# Patient Record
Sex: Male | Born: 1975 | Race: Black or African American | Hispanic: No | Marital: Single | Smoking: Never smoker
Health system: Southern US, Community
[De-identification: ages and names within clinical notes are randomized; demographics above are authoritative.]

## PROBLEM LIST (undated history)

## (undated) DIAGNOSIS — E119 Type 2 diabetes mellitus without complications: Secondary | ICD-10-CM

---

## 2011-01-30 ENCOUNTER — Emergency Department (HOSPITAL_COMMUNITY)
Admission: EM | Admit: 2011-01-30 | Discharge: 2011-01-31 | Disposition: A | Discharge status: Home / Self Care | Payer: Self-pay | Attending: Emergency Medicine | Admitting: Emergency Medicine

## 2011-01-30 DIAGNOSIS — K089 Disorder of teeth and supporting structures, unspecified: Secondary | ICD-10-CM | POA: Insufficient documentation

## 2011-01-30 DIAGNOSIS — K047 Periapical abscess without sinus: Secondary | ICD-10-CM | POA: Insufficient documentation

## 2012-02-16 ENCOUNTER — Emergency Department (HOSPITAL_COMMUNITY)
Admission: EM | Admit: 2012-02-16 | Discharge: 2012-02-16 | Disposition: A | Discharge status: Home / Self Care | Payer: Self-pay | Attending: Emergency Medicine | Admitting: Emergency Medicine

## 2012-02-16 ENCOUNTER — Encounter (HOSPITAL_COMMUNITY): Payer: Self-pay | Admitting: Emergency Medicine

## 2012-02-16 DIAGNOSIS — K047 Periapical abscess without sinus: Secondary | ICD-10-CM | POA: Insufficient documentation

## 2012-02-16 MED ORDER — CLINDAMYCIN HCL 150 MG PO CAPS
300.0000 mg | ORAL_CAPSULE | Freq: Once | ORAL | Status: AC
  Administered 2012-02-16: 300 mg via ORAL
  Filled 2012-02-16: qty 2

## 2012-02-16 MED ORDER — CLINDAMYCIN HCL 150 MG PO CAPS: 150.0000 mg | ORAL_CAPSULE | Freq: Four times a day (QID) | ORAL | Status: DC

## 2012-02-16 MED ORDER — CLINDAMYCIN HCL 300 MG PO CAPS: 150.0000 mg | ORAL_CAPSULE | Freq: Three times a day (TID) | ORAL | Status: DC

## 2012-02-16 NOTE — Discharge Instructions (Signed)
Dental Abscess A dental abscess usually starts from an infected tooth. Antibiotic medicine and pain pills can be helpful, but dental infections require the attention of a dentist. Rinse around the infected area often with salt water (a pinch of salt in 8 oz of warm water). Do not apply heat to the outside of your face. See your dentist or oral surgeon as soon as possible.  SEEK IMMEDIATE MEDICAL CARE IF:  You have increasing, severe pain that is not relieved by medicine.   You or your child has an oral temperature above 102 F (38.9 C), not controlled by medicine.   Your baby is older than 3 months with a rectal temperature of 102 F (38.9 C) or higher.   Your baby is 3 months old or younger with a rectal temperature of 100.4 F (38 C) or higher.   You develop chills, severe headache, difficulty breathing, or trouble swallowing.   You have swelling in the neck or around the eye.  Document Released: 05/15/2005 Document Revised: 05/04/2011 Document Reviewed: 10/24/2006 [employer] Patient Information 2012 [employer]. 

## 2012-02-16 NOTE — ED Notes (Signed)
C/o abscess in L side of mouth x approx 2 months.

## 2012-02-16 NOTE — ED Notes (Signed)
Pt alert and oriented, with steady gait at time of discharge. Pt given discharge papers and papers explained. All questions answered and pt walked to discharge.  

## 2012-02-16 NOTE — ED Provider Notes (Signed)
History     CSN: [id number]  Arrival date & time 02/16/12  2015   First MD Initiated Contact with Patient 02/16/12 2039      Chief Complaint  Patient presents with  . Abscess    (Consider location/radiation/quality/duration/timing/severity/associated sxs/prior treatment) HPI Comments: Patient states, that he has had a dental abscess in the left lower area since January that waxes and wanes in intensity.  Has not seen a dentist since it began.  Patient is a 36 y.o. male presenting with abscess. The history is provided by the patient.  Abscess  This is a chronic problem. The current episode started more than one week ago. The problem occurs continuously. The problem has been unchanged. Pertinent negatives include no fever.    History reviewed. No pertinent past medical history.  History reviewed. No pertinent past surgical history.  No family history on file.  History  Substance Use Topics  . Smoking status: Never Smoker   . Smokeless tobacco: Not on file  . Alcohol Use: No      Review of Systems  Constitutional: Negative for fever and chills.  HENT: Positive for dental problem.   Musculoskeletal: Negative for myalgias.  Skin: Negative for wound.  Neurological: Negative for dizziness and headaches.    Allergies  Penicillins  Home Medications   Current Outpatient Rx  Name Route Sig Dispense Refill  . ACETAMINOPHEN 500 MG PO TABS Oral Take 1,000 mg by mouth every 4 (four) hours as needed. For pain    . IBUPROFEN 200 MG PO TABS Oral Take 1,200 mg by mouth every 6 (six) hours as needed. For pain    . ADULT MULTIVITAMIN W/MINERALS CH Oral Take 1 tablet by mouth daily.    Marland Kitchen CLINDAMYCIN HCL 150 MG PO CAPS Oral Take 1 capsule (150 mg total) by mouth every 6 (six) hours. 28 capsule 0    BP 162/90  Pulse 76  Temp 97.7 F (36.5 C) (Oral)  Resp 16  SpO2 98%  Physical Exam  Constitutional: He is oriented to person, place, and time. He appears well-developed.  HENT:   Head: Normocephalic.  Mouth/Throat:    Eyes: Pupils are equal, round, and reactive to light.  Neck: Normal range of motion.  Cardiovascular: Normal rate.   Pulmonary/Chest: Effort normal.  Musculoskeletal: Normal range of motion.  Neurological: He is alert and oriented to person, place, and time.  Skin: Skin is warm.    ED Course  Procedures (including critical care time)  Labs Reviewed - No data to display No results found.   1. Dental abscess       MDM  Patient is too small pus pocket along the anterior ridge of the gum lower left, I will treat with antibiotics.  Due to is allergies to, penicillin.  We'll treat with clindamycin and refer him to a dentist         Arman Filter, NP 02/16/12 [phone]

## 2012-02-17 NOTE — ED Provider Notes (Signed)
Medical screening examination/treatment/procedure(s) were performed by non-physician practitioner and as supervising physician I was immediately available for consultation/collaboration.  Tamila Gaulin L Yedidya Duddy, MD 02/17/12 1322 

## 2013-08-10 ENCOUNTER — Emergency Department (HOSPITAL_COMMUNITY)
Admission: EM | Admit: 2013-08-10 | Discharge: 2013-08-10 | Disposition: A | Discharge status: Home / Self Care | Payer: Self-pay | Attending: Emergency Medicine | Admitting: Emergency Medicine

## 2013-08-10 ENCOUNTER — Encounter (HOSPITAL_COMMUNITY): Payer: Self-pay | Admitting: Emergency Medicine

## 2013-08-10 DIAGNOSIS — E119 Type 2 diabetes mellitus without complications: Secondary | ICD-10-CM | POA: Insufficient documentation

## 2013-08-10 DIAGNOSIS — K089 Disorder of teeth and supporting structures, unspecified: Secondary | ICD-10-CM | POA: Insufficient documentation

## 2013-08-10 DIAGNOSIS — Z880 Allergy status to penicillin: Secondary | ICD-10-CM | POA: Insufficient documentation

## 2013-08-10 DIAGNOSIS — K029 Dental caries, unspecified: Secondary | ICD-10-CM | POA: Insufficient documentation

## 2013-08-10 DIAGNOSIS — K0889 Other specified disorders of teeth and supporting structures: Secondary | ICD-10-CM

## 2013-08-10 HISTORY — DX: Type 2 diabetes mellitus without complications: E11.9

## 2013-08-10 MED ORDER — HYDROCODONE-ACETAMINOPHEN 5-325 MG PO TABS: 2.0000 | ORAL_TABLET | ORAL | Status: DC | PRN

## 2013-08-10 MED ORDER — CLINDAMYCIN HCL 150 MG PO CAPS: 150.0000 mg | ORAL_CAPSULE | Freq: Four times a day (QID) | ORAL | Status: DC

## 2013-08-10 NOTE — ED Provider Notes (Signed)
Medical screening examination/treatment/procedure(s) were performed by non-physician practitioner and as supervising physician I was immediately available for consultation/collaboration.   EKG Interpretation None        Richardean Canalavid H Lealand Elting, MD 08/10/13 2101

## 2013-08-10 NOTE — ED Provider Notes (Signed)
CSN: [id number]     Arrival date & time 08/10/13  1720 History  This chart was scribed for non-physician practitioner Marlon Pel  working with Richardean Canal, MD by Elveria Rising, ED Scribe. This patient was seen in room WTR5/WTR5 and the patient's care was started at 5:39 PM.   Chief Complaint  Patient presents with  . Dental Pain     The history is provided by the patient. No language interpreter was used.   HPI Comments: Andres Graham is a 38 y.o. male who presents to the Emergency Department complaining of lower left dental pain, onset two weeks ago. Throbbing dental pain due to a broken tooth. Patient says he prompted to come in today because his mother was concerned it may be infected. Patient does not have dentist.   Past Medical History  Diagnosis Date  . Diabetes mellitus without complication    History reviewed. No pertinent past surgical history. No family history on file. History  Substance Use Topics  . Smoking status: Never Smoker   . Smokeless tobacco: Not on file  . Alcohol Use: No    Review of Systems  HENT: Positive for dental problem.   All other systems reviewed and are negative.      Allergies  Penicillins  Home Medications   Current Outpatient Rx  Name  Route  Sig  Dispense  Refill  . acetaminophen (TYLENOL) 500 MG tablet   Oral   Take 1,000 mg by mouth every 4 (four) hours as needed. For pain         . clindamycin (CLEOCIN) 150 MG capsule   Oral   Take 1 capsule (150 mg total) by mouth every 6 (six) hours.   28 capsule   0   . clindamycin (CLEOCIN) 150 MG capsule   Oral   Take 1 capsule (150 mg total) by mouth every 6 (six) hours.   28 capsule   0   . HYDROcodone-acetaminophen (NORCO/VICODIN) 5-325 MG per tablet   Oral   Take 2 tablets by mouth every 4 (four) hours as needed.   6 tablet   0   . ibuprofen (ADVIL,MOTRIN) 200 MG tablet   Oral   Take 1,200 mg by mouth every 6 (six) hours as needed. For pain         . Multiple  Vitamin (MULTIVITAMIN WITH MINERALS) TABS   Oral   Take 1 tablet by mouth daily.          Triage Vitals: BP 142/88  Pulse 88  Temp(Src) 98.4 F (36.9 C) (Oral)  Resp 16  SpO2 97% Physical Exam  Nursing note and vitals reviewed. Constitutional: He is oriented to person, place, and time. He appears well-developed and well-nourished. No distress.  HENT:  Head: Normocephalic and atraumatic.  Mouth/Throat: Dental caries present.    Eyes: Conjunctivae and EOM are normal. Pupils are equal, round, and reactive to light.  Neck: Normal range of motion. Neck supple. No tracheal deviation present.  Cardiovascular: Normal rate and regular rhythm.   Pulmonary/Chest: Effort normal and breath sounds normal. No respiratory distress.  Musculoskeletal: Normal range of motion.  Neurological: He is alert and oriented to person, place, and time.  Skin: Skin is warm and dry.  Psychiatric: He has a normal mood and affect. His behavior is normal.    ED Course  Procedures (including critical care time) DIAGNOSTIC STUDIES: Oxygen Saturation is 97% on room air, adequate by my interpretation.    COORDINATION OF CARE: 5:43 PM-  Will prescribe pain medication and antibiotic. Will refer patient to dentist. Pt advised of plan for treatment and pt agrees.    Labs Review Labs Reviewed - No data to display Imaging Review No results found.   EKG Interpretation None      MDM   Final diagnoses:  Pain, dental    Patient has dental pain. No emergent s/sx's present. Patent airway. No trismus.  Will be given pain medication and antibiotics. I discussed the need to call dentist within 24/48 hours for follow-up. Dental referral given. Return to ED precautions given.  Pt voiced understanding and has agreed to follow-up.   38 y.o.[name]'s evaluation in the Emergency Department is complete. It has been determined that no acute conditions requiring further emergency intervention are present at  this time. The patient/guardian have been advised of the diagnosis and plan. We have discussed signs and symptoms that warrant return to the ED, such as changes or worsening in symptoms.  Vital signs are stable at discharge. Filed Vitals:   08/10/13 1724  BP: 142/88  Pulse: 88  Temp: 98.4 F (36.9 C)  Resp: 16    Patient/guardian has voiced understanding and agreed to follow-up with the PCP or specialist.  I personally performed the services described in this documentation, which was scribed in my presence. The recorded information has been reviewed and is accurate.   Dorthula Matasiffany G Alona Danford, PA-C 08/10/13 1751

## 2013-08-10 NOTE — ED Notes (Addendum)
Having lower left tooth pain for about  2 weeks. Place where there is pain tooth has broken off

## 2013-08-10 NOTE — Discharge Instructions (Signed)
Dental Pain A tooth ache may be caused by cavities (tooth decay). Cavities expose the nerve of the tooth to air and hot or cold temperatures. It may come from an infection or abscess (also called a boil or furuncle) around your tooth. It is also often caused by dental caries (tooth decay). This causes the pain you are having. DIAGNOSIS  Your caregiver can diagnose this problem by exam. TREATMENT   If caused by an infection, it may be treated with medications which kill germs (antibiotics) and pain medications as prescribed by your caregiver. Take medications as directed.  Only take over-the-counter or prescription medicines for pain, discomfort, or fever as directed by your caregiver.  Whether the tooth ache today is caused by infection or dental disease, you should see your dentist as soon as possible for further care. SEEK MEDICAL CARE IF: The exam and treatment you received today has been provided on an emergency basis only. This is not a substitute for complete medical or dental care. If your problem worsens or new problems (symptoms) appear, and you are unable to meet with your dentist, call or return to this location. SEEK IMMEDIATE MEDICAL CARE IF:   You have a fever.  You develop redness and swelling of your face, jaw, or neck.  You are unable to open your mouth.  You have severe pain uncontrolled by pain medicine. MAKE SURE YOU:   Understand these instructions.  Will watch your condition.  Will get help right away if you are not doing well or get worse. Document Released: 05/15/2005 Document Revised: 08/07/2011 Document Reviewed: 01/01/2008 [facility] Patient Information 2014 [place].  Dental Care and Dentist Visits Dental care supports good overall health. Regular dental visits can also help you avoid dental pain, bleeding, infection, and other more serious health problems in the future. It is important to keep the mouth healthy because diseases in the teeth, gums,  and other oral tissues can spread to other areas of the body. Some problems, such as diabetes, heart disease, and pre-term labor have been associated with poor oral health.  See your dentist every 6 months. If you experience emergency problems such as a toothache or broken tooth, go to the dentist right away. If you see your dentist regularly, you may catch problems early. It is easier to be treated for problems in the early stages.  WHAT TO EXPECT AT A DENTIST VISIT  Your dentist will look for many common oral health problems and recommend proper treatment. At your regular dental visit, you can expect:  Gentle cleaning of the teeth and gums. This includes scraping and polishing. This helps to remove the sticky substance around the teeth and gums (plaque). Plaque forms in the mouth shortly after eating. Over time, plaque hardens on the teeth as tartar. If tartar is not removed regularly, it can cause problems. Cleaning also helps remove stains.  Periodic X-rays. These pictures of the teeth and supporting bone will help your dentist assess the health of your teeth.  Periodic fluoride treatments. Fluoride is a natural mineral shown to help strengthen teeth. Fluoride treatmentinvolves applying a fluoride gel or varnish to the teeth. It is most commonly done in children.  Examination of the mouth, tongue, jaws, teeth, and gums to look for any oral health problems, such as:  Cavities (dental caries). This is decay on the tooth caused by plaque, sugar, and acid in the mouth. It is best to catch a cavity when it is small.  Inflammation of the gums  caused by plaque buildup (gingivitis).  Problems with the mouth or malformed or misaligned teeth.  Oral cancer or other diseases of the soft tissues or jaws. KEEP YOUR TEETH AND GUMS HEALTHY For healthy teeth and gums, follow these general guidelines as well as your dentist's specific advice:  Have your teeth professionally cleaned at the dentist every 6  months.  Brush twice daily with a fluoride toothpaste.  Floss your teeth daily.  Ask your dentist if you need fluoride supplements, treatments, or fluoride toothpaste.  Eat a healthy diet. Reduce foods and drinks with added sugar.  Avoid smoking. TREATMENT FOR ORAL HEALTH PROBLEMS If you have oral health problems, treatment varies depending on the conditions present in your teeth and gums.  Your caregiver will most likely recommend good oral hygiene at each visit.  For cavities, gingivitis, or other oral health disease, your caregiver will perform a procedure to treat the problem. This is typically done at a separate appointment. Sometimes your caregiver will refer you to another dental specialist for specific tooth problems or for surgery. SEEK IMMEDIATE DENTAL CARE IF:  You have pain, bleeding, or soreness in the gum, tooth, jaw, or mouth area.  A permanent tooth becomes loose or separated from the gum socket.  You experience a blow or injury to the mouth or jaw area. Document Released: 01/25/2011 Document Revised: 08/07/2011 Document Reviewed: 01/25/2011 [facility] Patient Information 2014 [place], [place].   RESOURCE GUIDE  Chronic Pain Problems: Contact [facility]  [phone] Patients need to be referred by their primary care doctor.  Insufficient Money for Medicine: Contact [employer]:  call "211" or [place] [phone].  No Primary Care Doctor: Call [facility] Connect  [phone] - can help you locate a primary care doctor that  accepts your insurance, provides certain services, etc. Physician Referral Service- [phone]  Agencies that provide inexpensive medical care: [facility]  [facility]  [phone] [facility]  [phone] [facility]  [phone] [facility]  [phone] [facility]  [phone]  [place] Providers: [facility]  [facility]- [address]  [phone], Mon-Fri 9am-7pm, Sat 9am-1pm [place], [address] [place]  [place], [address] [place]  [place]- [address]  [place], [address], [phone]  Only accepts [place] patients after they have their name  applied to their card  Self Pay (no insurance) in [facility]: Sickle Cell Patients: Dr Kevan Ny, [facility]  [place], [facility]- [place]  [facility] [place]- [address], [facility]- see information above (Speak to [employer] if you do not have insurance)       -  [facility]- [place], [place],  [place] [address], [phone]       -  Dr Vista Lawman-  [address], [place], [facility]  [facility]- [address] [phone]       -  [facility] [place]- [address], [place], also [address], [phone]       -    [facility]- [address], [place], 1st & 3rd Saturday   every month, 10am-1pm  1) Find a Doctor and Pay Out of Pocket Although you won't have to find out who is covered by your insurance plan, it is a good idea to ask around and get recommendations. You will then need to call the office and see if the doctor you have chosen will accept you as a new patient and what types of options they offer for patients who are self-pay. Some doctors offer discounts or will set up payment plans for their patients who do not have insurance, but you will need to ask so you aren't surprised when you get to your appointment.  2) Contact Your Local Health  Department Not all health departments have doctors that can see patients for sick visits, but many do, so it is worth a call to see if yours does. If you don't know where your local health department is, you can check in your phone book. The CDC also has a tool to help you locate your state's health department, and many state websites also have listings of all of their local health departments.  3) Find a [facility] If your illness is not likely to be very severe or complicated, you may want to try a walk in clinic. These are popping up all over the country in pharmacies, drugstores, and shopping centers. They're usually staffed by nurse practitioners or physician assistants that have been trained to treat common illnesses and complaints. They're usually fairly quick and inexpensive. However, if you have serious medical issues or chronic medical problems, these are probably not your best option  [facility] [place], [facility], [address] [place], phone [phone] or [phone].  Monday - Friday, call for an appointment. [place], [facility] Clinic, [address], [place], phone [phone] or [phone].  Monday - Friday, call for an appointment.  Abuse/Neglect: [place] [phone] [place] [phone] (After Hours)  Emergency Shelter:  [facility] [phone]  Maternity Homes: Room at the [place] [phone] [place] [phone]  MRSA Hotline #:   (603) 327-1820  [facility] Dept. [address]         [place] Phone:  [phone]                                   Phone:  [phone]                   Phone:  [place], [place]- [phone]       -     [facility] in [place], [address],                                  [place] [phone] or [phone] (After Hours)   [place]  Substance Abuse Resources: Alcohol and Drug Services  [phone] [place] [phone] The [facility] Chinita P[facility] [phone] Residential & Outpatient Substance Abuse Program  [phone]  Psychological Services: [place]  [phone] [place]  [place], [place]. [address] [place], [place]: [phone] or [phone], [email or web address]  Dental Assistance  If unable to pay or uninsured, contact:  [facility] or [facility]. to become qualified for the adult dental clinic.  Patients with Medicaid: [facility] [phone] [address] [place], [place] [address] [phone]  If unable to pay, or uninsured, contact [facility] [phone]) or [place] [phone] in [place], [place] in [facility]) to become qualified for the adult dental clinic  Other [place]- [place], [place], [place], [address], [place], [place], 2nd and 4th Thursday of the month at 6:30am.  10 clients each day by appointment, can sometimes see walk-in patients if someone does not show for an appointment. [facility] [place]- [address] Hillard Danker [place], [place], [address], [place], [place], [place], [address], [place] [facility] [phone]  Please make every effort to establish with a primary care physician for routine medical care  [place]  The [place] provides a wide range of adult health services. Some of these services are designed to address the healthcare needs of all [facility] residents and all services are designed to meet the needs of uninsured/underinsured low income residents. Some services are available to any resident of [place], call [phone] for details. ] The [facility], a new medical clinic for adults, is now open. For more information about the Center and its services please call [phone]. For information on our [place] services, click here.  For more information on any of the following Department of Public Health programs, including hours of service, click on the highlighted link.  SERVICES FOR WOMEN (Adults and Teens) [employer] provide a full range of birth control options plus education and counseling. New patient visit and annual return visits include a complete examination, pap test as indicated, and other laboratory as indicated. Included is our [employer] for men.  Maternity Care is provided through pregnancy, including a six week post partum exam. Women who meet eligibility criteria for the Medicaid for Pregnant Women program, receive care free. Other women are charged on a sliding scale according to income. Note: [facility] provides services to pregnant women who have a Medicaid card. Call [phone] for an appointment in [place] or [phone] for an appointment in [facility].  Primary Care for [facility] [place] [place] Access Women is available through the [facility]  [place]. As primary care provider for the [place] program, women may  designate the [facility] clinic as their primary care provider.  PLEASE CALL [phone] FOR AN APPOINTMENT FOR THE ABOVE SERVICES IN EITHER [place] OR [place]. Information available in [place] and [place].   Childbirth Education Classes are open to the public and offered to help families prepare for the best possible childbirth experience as well as to promote lifelong health and wellness. Classes are offered throughout the year and meet on the same night once a week for five weeks. Medicaid covers the cost of the classes for the mother-to-be and her partner. For participants without Medicaid, the cost of the class series is $45.00 for the mother-to-be and her partner. Class size is limited and registration is required. For more information or to register call [phone]. Baby items donated by [employer] and the [employer] [place] are given away during each class series.  SERVICES FOR WOMEN AND MEN Sexually Transmitted Infection appointments, including HIV testing, are available daily (weekdays, except holidays). Call early as same-day appointments are limited. For an appointment in either [facility] or [place], call [phone]. Services are confidential and free of charge.  Skin Testing for Tuberculosis Please call [phone]. Adult Immunizations are available, usually for a fee. Please call [phone] for details.  PLEASE CALL [phone] FOR AN APPOINTMENT FOR THE ABOVE SERVICES IN EITHER [employer] OR [place].   [facility] provides up to the minute recommended vaccines for your travel destination. We also provide essential health and political information to help insure a safe and pleasurable travel experience. This program is self-sustaining, however, fees are very competitive. We are a CERTIFIED YELLOW FEVER IMMUNIZATION approved clinic site. PLEASE CALL [phone] FOR AN APPOINTMENT IN EITHER [place] OR [place].   If you have questions about the services  listed above, we want to answer them! Email [place] at: [email or web address] Home Visiting Services for elderly and the disabled are available to residents of [facility] who are in need of care that compares to the care offered by a nursing home, have needs that can be met by the program, and have [place] Medicaid. Other short term services are available to residents 18 years and older who are unable to meet requirements for eligibility to receive services from a certified home health agency, spend the majority of time at home, and need care for six months or less.  PLEASE CALL [id number] OR [phone] FOR MORE INFORMATION. Medication Assistance Program serves as a link between pharmaceutical companies and patients to provide low cost or free prescription medications. This servce is available for residents who meet certain income restrictions and have no insurance coverage.  PLEASE CALL [phone] ([place]) OR [phone] ([place]) FOR MORE INFORMATION.  Updated Feb. 21, 2013

## 2013-12-25 ENCOUNTER — Encounter (HOSPITAL_COMMUNITY): Payer: Self-pay | Admitting: Emergency Medicine

## 2013-12-25 ENCOUNTER — Emergency Department (HOSPITAL_COMMUNITY)
Admission: EM | Admit: 2013-12-25 | Discharge: 2013-12-25 | Disposition: A | Discharge status: Home / Self Care | Payer: Self-pay | Attending: Emergency Medicine | Admitting: Emergency Medicine

## 2013-12-25 DIAGNOSIS — Z79899 Other long term (current) drug therapy: Secondary | ICD-10-CM | POA: Insufficient documentation

## 2013-12-25 DIAGNOSIS — K5900 Constipation, unspecified: Secondary | ICD-10-CM | POA: Insufficient documentation

## 2013-12-25 DIAGNOSIS — R209 Unspecified disturbances of skin sensation: Secondary | ICD-10-CM | POA: Insufficient documentation

## 2013-12-25 DIAGNOSIS — E119 Type 2 diabetes mellitus without complications: Secondary | ICD-10-CM | POA: Insufficient documentation

## 2013-12-25 DIAGNOSIS — E1165 Type 2 diabetes mellitus with hyperglycemia: Secondary | ICD-10-CM

## 2013-12-25 DIAGNOSIS — Z880 Allergy status to penicillin: Secondary | ICD-10-CM | POA: Insufficient documentation

## 2013-12-25 LAB — CBC WITH DIFFERENTIAL/PLATELET
BASOS PCT: 0 % (ref 0–1)
Basophils Absolute: 0 10*3/uL (ref 0.0–0.1)
EOS ABS: 0.1 10*3/uL (ref 0.0–0.7)
EOS PCT: 2 % (ref 0–5)
HCT: 41.2 % (ref 39.0–52.0)
HEMOGLOBIN: 14.6 g/dL (ref 13.0–17.0)
LYMPHS ABS: 3.6 10*3/uL (ref 0.7–4.0)
Lymphocytes Relative: 50 % — ABNORMAL HIGH (ref 12–46)
MCH: 30.2 pg (ref 26.0–34.0)
MCHC: 35.4 g/dL (ref 30.0–36.0)
MCV: 85.1 fL (ref 78.0–100.0)
MONO ABS: 0.4 10*3/uL (ref 0.1–1.0)
MONOS PCT: 6 % (ref 3–12)
NEUTROS PCT: 42 % — AB (ref 43–77)
Neutro Abs: 3.1 10*3/uL (ref 1.7–7.7)
Platelets: 266 10*3/uL (ref 150–400)
RBC: 4.84 MIL/uL (ref 4.22–5.81)
RDW: 11.6 % (ref 11.5–15.5)
WBC: 7.2 10*3/uL (ref 4.0–10.5)

## 2013-12-25 LAB — COMPREHENSIVE METABOLIC PANEL
ALK PHOS: 43 U/L (ref 39–117)
ALT: 11 U/L (ref 0–53)
AST: 12 U/L (ref 0–37)
Albumin: 4.1 g/dL (ref 3.5–5.2)
Anion gap: 11 (ref 5–15)
BUN: 13 mg/dL (ref 6–23)
CO2: 25 meq/L (ref 19–32)
Calcium: 9.7 mg/dL (ref 8.4–10.5)
Chloride: 101 mEq/L (ref 96–112)
Creatinine, Ser: 0.75 mg/dL (ref 0.50–1.35)
GFR calc non Af Amer: >90 mL/min (ref >90)
GLUCOSE: 248 mg/dL — AB (ref 70–99)
POTASSIUM: 4.5 meq/L (ref 3.7–5.3)
SODIUM: 137 meq/L (ref 137–147)
TOTAL PROTEIN: 7.8 g/dL (ref 6.0–8.3)
Total Bilirubin: 0.7 mg/dL (ref 0.3–1.2)

## 2013-12-25 LAB — CBG MONITORING, ED: Glucose-Capillary: 218 mg/dL — ABNORMAL HIGH (ref 70–99)

## 2013-12-25 MED ORDER — METFORMIN HCL 500 MG PO TABS: 500.0000 mg | ORAL_TABLET | Freq: Two times a day (BID) | ORAL | Status: DC

## 2013-12-25 NOTE — Discharge Instructions (Signed)
Hyperglycemia °Hyperglycemia occurs when the glucose (sugar) in your blood is too high. Hyperglycemia can happen for many reasons, but it most often happens to people who do not know they have diabetes or are not managing their diabetes properly.  °CAUSES  °Whether you have diabetes or not, there are other causes of hyperglycemia. Hyperglycemia can occur when you have diabetes, but it can also occur in other situations that you might not be as aware of, such as: °Diabetes °· If you have diabetes and are having problems controlling your blood glucose, hyperglycemia could occur because of some of the following reasons: °¨ Not following your meal plan. °¨ Not taking your diabetes medications or not taking it properly. °¨ Exercising less or doing less activity than you normally do. °¨ Being sick. °Pre-diabetes °· This cannot be ignored. Before people develop Type 2 diabetes, they almost always have "pre-diabetes." This is when your blood glucose levels are higher than normal, but not yet high enough to be diagnosed as diabetes. Research has shown that some long-term damage to the body, especially the heart and circulatory system, may already be occurring during pre-diabetes. If you take action to manage your blood glucose when you have pre-diabetes, you may delay or prevent Type 2 diabetes from developing. °Stress °· If you have diabetes, you may be "diet" controlled or on oral medications or insulin to control your diabetes. However, you may find that your blood glucose is higher than usual in the hospital whether you have diabetes or not. This is often referred to as "stress hyperglycemia." Stress can elevate your blood glucose. This happens because of hormones put out by the body during times of stress. If stress has been the cause of your high blood glucose, it can be followed regularly by your caregiver. That way he/she can make sure your hyperglycemia does not continue to get worse or progress to  diabetes. °Steroids °· Steroids are medications that act on the infection fighting system (immune system) to block inflammation or infection. One side effect can be a rise in blood glucose. Most people can produce enough extra insulin to allow for this rise, but for those who cannot, steroids make blood glucose levels go even higher. It is not unusual for steroid treatments to "uncover" diabetes that is developing. It is not always possible to determine if the hyperglycemia will go away after the steroids are stopped. A special blood test called an A1c is sometimes done to determine if your blood glucose was elevated before the steroids were started. °SYMPTOMS °· Thirsty. °· Frequent urination. °· Dry mouth. °· Blurred vision. °· Tired or fatigue. °· Weakness. °· Sleepy. °· Tingling in feet or leg. °DIAGNOSIS  °Diagnosis is made by monitoring blood glucose in one or all of the following ways: °· A1c test. This is a chemical found in your blood. °· Fingerstick blood glucose monitoring. °· Laboratory results. °TREATMENT  °First, knowing the cause of the hyperglycemia is important before the hyperglycemia can be treated. Treatment may include, but is not be limited to: °· Education. °· Change or adjustment in medications. °· Change or adjustment in meal plan. °· Treatment for an illness, infection, etc. °· More frequent blood glucose monitoring. °· Change in exercise plan. °· Decreasing or stopping steroids. °· Lifestyle changes. °HOME CARE INSTRUCTIONS  °· Test your blood glucose as directed. °· Exercise regularly. Your caregiver will give you instructions about exercise. Pre-diabetes or diabetes which comes on with stress is helped by exercising. °· Eat wholesome,   balanced meals. Eat often and at regular, fixed times. Your caregiver or nutritionist will give you a meal plan to guide your sugar intake. °· Being at an ideal weight is important. If needed, losing as little as 10 to 15 pounds may help improve blood  glucose levels. °SEEK MEDICAL CARE IF:  °· You have questions about medicine, activity, or diet. °· You continue to have symptoms (problems such as increased thirst, urination, or weight gain). °SEEK IMMEDIATE MEDICAL CARE IF:  °· You are vomiting or have diarrhea. °· Your breath smells fruity. °· You are breathing faster or slower. °· You are very sleepy or incoherent. °· You have numbness, tingling, or pain in your feet or hands. °· You have chest pain. °· Your symptoms get worse even though you have been following your caregiver's orders. °· If you have any other questions or concerns. °Document Released: 11/08/2000 Document Revised: 08/07/2011 Document Reviewed: 09/11/2011 °ExitCare® Patient Information ©2015 [employer]. This information is not intended to replace advice given to you by your health care provider. Make sure you discuss any questions you have with your health care provider. ° ° °Emergency Department Resource Guide °1) Find a Doctor and Pay Out of Pocket °Although you won't have to find out who is covered by your insurance plan, it is a good idea to ask around and get recommendations. You will then need to call the office and see if the doctor you have chosen will accept you as a new patient and what types of options they offer for patients who are self-pay. Some doctors offer discounts or will set up payment plans for their patients who do not have insurance, but you will need to ask so you aren't surprised when you get to your appointment. ° °2) Contact Your Local Health Department °Not all health departments have doctors that can see patients for sick visits, but many do, so it is worth a call to see if yours does. If you don't know where your local health department is, you can check in your phone book. The CDC also has a tool to help you locate your state's health department, and many state websites also have listings of all of their local health departments. ° °3) Find a Walk-in Clinic °If  your illness is not likely to be very severe or complicated, you may want to try a walk in clinic. These are popping up all over the country in pharmacies, drugstores, and shopping centers. They're usually staffed by nurse practitioners or physician assistants that have been trained to treat common illnesses and complaints. They're usually fairly quick and inexpensive. However, if you have serious medical issues or chronic medical problems, these are probably not your best option. ° °No Primary Care Doctor: °- Call [facility] at  832-8000 - they can help you locate a primary care doctor that  accepts your insurance, provides certain services, etc. °- Physician Referral Service- 1-800-533-3463 ° °Chronic Pain Problems: °Organization         Address  Phone   Notes  °[facility]  (336) 297-2271 Patients need to be referred by their primary care doctor.  ° °Medication Assistance: °Organization         Address  Phone   Notes  °[place] [facility] [address] °, [place] [address] (336) 641-8030 --Must be a resident of [place] °-- Must have NO insurance coverage whatsoever (no Medicaid/ Medicare, etc.) °-- The pt. MUST have   a primary care doctor that directs their care regularly and follows them in the community °  °[facility]  (866) 331-1348   °[employer]  (888) 892-1162   ° °Agencies that provide inexpensive medical care: °Organization         Address  Phone   Notes  °[facility]  (336) 832-8035   °[facility]    (336) 832-7272   °[facility] [address] °[place], [place] [address] (336) 832-4777   °[facility] [address], °[place] (336) 271-4999   °[facility]    (336) 373-0678   °[facility]    (336) 272-1050   °[facility] ° [address], [place] Phone:  (336) 832-4444, Fax:  (336) 832-4440 Hours of  Operation:  9 am - 6 pm, M-F.  Also accepts Medicaid/Medicare and self-pay.  °[facility] ° [address], [place] Phone: (336) 832-3150, Fax: (336) [phone]. Hours of Operation:  8:30 am - 5:30 pm, M-F.  Also accepts Medicaid and self-pay.  °[facility] [place] [address], [place] Phone: (336) 878-6027   °[facility] [address], [place], [place] (336)723-1848, Ext. 123 Mondays & Thursdays: 7-9 AM.  First 15 patients are seen on a first come, first serve basis. °  ° °Medicaid-accepting [place] Providers: ° °Organization         Address  Phone   Notes  °[facility] [address], [place] (336) 641-2100 Also accepts self-pay patients.  °[facility] [address], [place] ° (336) 856-9996   °[facility] [address], [place] (336) 288-8857   °[facility] [address] [place] [address], [place] (336) 299-7000   °Veita Bland [address], [place]  ° (336) 373-1557 Only accepts [place] Access Medicaid patients after they have their name applied to their card.  ° °Self-Pay (no insurance) in [place]: ° °Organization         Address  Phone   Notes  °Sickle Cell Patients, [facility] [address], [place] (336) 832-1970   °[facility] [address], [place] (336) 832-4400   °[facility] ° [address], [place] (336) 992-4800   °[facility]/Dr. Osei-Bonsu ° [address] [place] [address], [place] or [address], [place] (336) 841-8500 Phone number for both [place] and [place] locations is the same.  °[facility] [address], [place] (336) 299-0000   °[facility] [address] [place] [address], [place] or [address] (336) 852-7530 °(336) 878-2260   °[facility] [address], [place] (336) 350-1642, phone; (336) 294-5005, fax Sees patients 1st and 3rd Saturday of every month.  Must not qualify for public or private insurance (i.e. Medicaid, Medicare, [place] Health Choice, Veterans' Benefits) • Household income should be no more than 200% of the poverty level •The clinic cannot treat you if you are pregnant or think you are pregnant • Sexually transmitted diseases are not treated at the clinic.  ° ° °Dental Care: °Organization         Address  Phone  Notes  °[facility] [facility] [address],  (  336) 641-6152 Accepts children up to age 21 who are enrolled in Medicaid or [place] Health Choice; pregnant women with a Medicaid card; and children who have applied for Medicaid or [place] Health Choice, but were declined, whose parents can pay a reduced fee at time of service.  °[facility] [place]  [address], [place] (336) 641-7733 Accepts children up to age 21 who are enrolled in Medicaid or [place] Health Choice; pregnant women with a Medicaid card; and children who have applied for Medicaid or [place] Health Choice, but were declined, whose parents can pay a reduced fee at time of service.  °[facility] ° [address], [place] (336) 641-4533 Patients are seen by appointment only. Walk-ins are not accepted. [facility] will see patients 18 years of age and older. °Monday - Tuesday (8am-5pm) °Most Wednesdays (8:30-5pm) °$30 per visit, cash only  °[facility] ° [address], [place] (336) 641-4533 Patients are seen by appointment only. Walk-ins are not accepted. [facility] will see patients 18 years of age and older. °One Wednesday Evening (Monthly: Volunteer Based).  $30 per visit, cash only  °[facility]  (919) 537-3737 for adults; Children under age 4, call Graduate Pediatric  Dentistry at (919) [phone]. Children aged 4-14, please call (919) [phone] to request a pediatric application. ° Dental services are provided in all areas of dental care including fillings, crowns and bridges, complete and partial dentures, implants, gum treatment, root canals, and extractions. Preventive care is also provided. Treatment is provided to both adults and children. °Patients are selected via a lottery and there is often a waiting list. °  °[facility] [address], °[place] ° (336) 763-8833 [email or web address] °  °[facility] [address], [place], [place] (336)723-1848, Ext. 123 Second and Fourth Thursday of each month, opens at 6:30 AM; Clinic ends at 9 AM.  Patients are seen on a first-come first-served basis, and a limited number are seen during each clinic.  ° °[facility] ° [address], [place], [place] (336) 723-7904   Eligibility Requirements °You must have lived in [place], [place], or [place] counties for at least the last three months. °  You cannot be eligible for state or federal sponsored healthcare insurance, including Veterans Administration, Medicaid, or Medicare. °  You generally cannot be eligible for healthcare insurance through your employer.  °  How to apply: °Eligibility screenings are held every Tuesday and Wednesday afternoon from 1:00 pm until 4:00 pm. You do not need an appointment for the interview!  °[facility] [address], [place], [place] 336-631-2330   °[facility]  336-342-8273   °[facility]  336-703-3100   °[facility]  336-570-6415   ° °Behavioral Health Resources in the Community: °Intensive Outpatient Programs °Organization         Address  Phone  Notes  °[facility] [address], [place], [place] 336-878-6098   °[facility] [address], [place], [place] 336-832-9800   °[facility] [address], [place], [place] ° 336-882-2125   °[facility] [address],  °[place], [place] 1-800-853-5163 or 336-641-4981   °Substance Abuse Resources °Organization         Address  Phone  Notes  °[facility]    336-882-2125   °[facility]  336-784-9470   °[facility]  336-285-9073   °[facility]  336-845-3988   °Residential & Outpatient Substance Abuse Program  1-800-659-3381   °Psychological Services °Organization         Address  Phone  Notes  °[facility]  336- 832-9600   °[facility]  336- 378-7881   °[facility] [address], [place] 1-800-853-5163 or 336-641-4981   ° °Mobile Crisis Teams °Organization         Address  Phone  Notes  °[facility], Mobile Crisis Care Unit  1-877-626-1772   °Assertive °[facility] ° [address] [place], [place] 336-834-9664   °Sharon DeEsch [address] °[place] 336-554-5454   ° °Self-Help/Support Groups °Organization         Address  Phone             Notes  °[facility] - variety of support groups  336- 373-1402 Call for more information  °Narcotics Anonymous (NA), [facility] [address], °[place]  2 meetings at this location  ° °Residential Treatment Programs °Organization         Address  Phone  Notes  °[facility] [address],    °[place]  1-866-801-8205   °[facility] ° [address], [place], [place] 704-293-8524   °[facility] [address], [place] 336-845-3988 Admissions: 8am-3pm M-F  °[facility] [address]    °[place], [place] 336-841-1104   °[facility] [address], [place], [place] 336-379-7146   °[facility] [address]  °[place], [place] 336-285-9073   °[facility] - Intensive Outpatient [address],  [place], [place] 336-852-3033   °[facility] [address]  °[place], [place] 1-877-615-2722 or 336-784-9470   °[facility] [address] [place], [place] 336-227-7417 Accepts Medicaid  °[facility] [address]  °[place] 1-800-659-3381 Substance Abuse/Addiction Treatment  ° °[place] Behavioral Health Resources °Organization         Address  Phone  Notes  °[facility]  (888) 581-9988   °Julie Brannon, PhD [address] [place], [place]   (336) 349-5553 or (336) 951-0000   °[facility]   [address] °[place], [place] (336) 349-4454   °[facility] [address], [place], [place] (336) 342-8316 Insurance/Medicaid/sponsorship through [facility]  °[facility] [address]                                    [place] (336) 342-8316 Therapy/tele-psych/case  °[facility] [address]  ° [place], [place] (336) 349-2233    °Dr. Arfeen  (336) 349-4544   °[facility] [place]  [employer] [place] [facility] 1) [address], [place] °2) [address], [place] °3)  [address], [place] (336) 349-3220 °(336) 342-7768 ° °(336) 342-8140   °[place] [facility] (336) 342-1394 or (336) 342-3537 (After Hours)    ° ° °

## 2013-12-25 NOTE — ED Provider Notes (Signed)
CSN: [id number]     Arrival date & time 12/25/13  1258 History   First MD Initiated Contact with Patient 12/25/13 1319     Chief Complaint  Patient presents with  . Numbness     (Consider location/radiation/quality/duration/timing/severity/associated sxs/prior Treatment) HPI Andres Graham is a 38 year old male with past medical history of diabetes type 2 who presents today with a four-day history of intermittent generalized dizziness, and paresthesia in his great toe bilaterally. The states he has not noticed any aggravating or alleviating factors for his dizziness. He states he seems to notice the paresthesia in his toes when he is lying in bed or sitting still. He states if he is more active the sensation seems to go away. Patient states she's never had these sensations before and currently in the ED he is asymptomatic. Patient states he was followed by his PCP for diabetes "several years ago", however has had no followup since his PCP advised him that his diabetes have resolved after she lost approximately 60 pounds and modified his diet. He states he has not checked his blood sugar since then. Patient denies syncope, chest pain, shortness of breath, nausea, vomiting, dysuria, increasing thirst, loss of vision,  blurred vision, claudication, leg swelling, leg pain.  Past Medical History  Diagnosis Date  . Diabetes mellitus without complication    History reviewed. No pertinent past surgical history. History reviewed. No pertinent family history. History  Substance Use Topics  . Smoking status: Never Smoker   . Smokeless tobacco: Not on file  . Alcohol Use: No    Review of Systems  Constitutional: Negative for fever, chills, activity change and fatigue.  Eyes: Negative for pain and visual disturbance.  Respiratory: Negative for shortness of breath.   Cardiovascular: Negative for chest pain and leg swelling.  Gastrointestinal: Positive for constipation. Negative for nausea, vomiting,  abdominal pain and diarrhea.  Endocrine: Negative for polydipsia.  Genitourinary: Negative for dysuria, urgency and frequency.  Musculoskeletal: Negative for myalgias.  Skin: Negative for rash.  Allergic/Immunologic: Negative for immunocompromised state.  Neurological: Positive for dizziness and numbness. Negative for syncope, weakness and headaches.  Psychiatric/Behavioral: Negative.       Allergies  Penicillins  Home Medications   Prior to Admission medications   Medication Sig Start Date End Date Taking? Authorizing Provider  Bisacodyl (LAXATIVE PO) Take 1 tablet by mouth as needed (for constipation).   Yes Historical Provider, MD  metFORMIN (GLUCOPHAGE) 500 MG tablet Take 1 tablet (500 mg total) by mouth 2 (two) times daily with a meal. 12/25/13   Monte Fantasia, PA-C   BP 135/85  Pulse 72  Temp(Src) 98.2 F (36.8 C) (Oral)  Resp 16  SpO2 98% Physical Exam  Constitutional: He is oriented to person, place, and time. He appears well-developed and well-nourished. No distress.  HENT:  Head: Normocephalic and atraumatic.  Eyes: Pupils are equal, round, and reactive to light.  Fundoscopic exam:      The right eye shows no arteriolar narrowing, no AV nicking and no exudate.       The left eye shows no arteriolar narrowing, no AV nicking and no exudate.  Cardiovascular: Normal rate, regular rhythm, S1 normal, S2 normal and normal heart sounds.  Exam reveals no gallop.   No murmur heard. Pulses:      Dorsalis pedis pulses are 2+ on the right side, and 2+ on the left side.       Posterior tibial pulses are 1+ on the right side, and  2+ on the left side.  Pulmonary/Chest: Effort normal and breath sounds normal. No respiratory distress.  Abdominal: Soft. Normal appearance. Bowel sounds are increased. There is no tenderness.  Neurological: He is alert and oriented to person, place, and time. He has normal strength. No cranial nerve deficit or sensory deficit.  Skin: He is not  diaphoretic.  Psychiatric: He has a normal mood and affect.    ED Course  Procedures (including critical care time) Labs Review Labs Reviewed  COMPREHENSIVE METABOLIC PANEL - Abnormal; Notable for the following:    Glucose, Bld 248 (*)    All other components within normal limits  CBC WITH DIFFERENTIAL - Abnormal; Notable for the following:    Neutrophils Relative % 42 (*)    Lymphocytes Relative 50 (*)    All other components within normal limits  CBG MONITORING, ED - Abnormal; Notable for the following:    Glucose-Capillary 218 (*)    All other components within normal limits    Imaging Review No results found.   EKG Interpretation   Date/Time:  Thursday December 25 2013 15:48:25 EDT Ventricular Rate:  71 PR Interval:  185 QRS Duration: 85 QT Interval:  380 QTC Calculation: 413 R Axis:   22 Text Interpretation:  Sinus rhythm No old tracing to compare Confirmed by  BELFI  MD, MELANIE ([id number]) on 12/25/2013 3:52:24 PM      MDM   Final diagnoses:  Hyperglycemia due to type 2 diabetes mellitus    Patient has nonspecific, intermittent dizziness, and paresthesias without weakness and a glucose level of 248. Patient states he has not eaten since last night. Patient reports he has not followed up with his PCP in "several years ". This presentation initially concerning for symptomatic hyperglycemia. After speaking with patient about his past history of diabetes, he reports that he has experienced some of the same symptoms in the past when his blood sugars have been elevated.  After benign physical exam and lab work only remarkable for hyperglycemia, it is discussed with patient that his symptoms are most likely a treated to his high blood sugar. Patient given a prescription for metformin and encouraged to followup with primary care in the near future for his diabetes. Patient was agreeable to this plan and stated that he will pay more attention to his diet and follow up with his PCP.   We encouraged patient to call or return to the ED should he have any questions or concerns.    Monte FantasiaJoseph W Shontay Wallner, PA-C 12/26/13 0035  Monte FantasiaJoseph W Zuleika Gallus, PA-C 12/26/13 [phone]

## 2013-12-25 NOTE — ED Notes (Signed)
Per pt, states his feet are numb, and he hasn't had a BM so he took a laxative and doesn't feel any better-states his body feels weird

## 2013-12-26 NOTE — ED Provider Notes (Signed)
Medical screening examination/treatment/procedure(s) were performed by non-physician practitioner and as supervising physician I was immediately available for consultation/collaboration.   EKG Interpretation   Date/Time:  Thursday December 25 2013 15:48:25 EDT Ventricular Rate:  71 PR Interval:  185 QRS Duration: 85 QT Interval:  380 QTC Calculation: 413 R Axis:   22 Text Interpretation:  Sinus rhythm No old tracing to compare Confirmed by  BELFI  MD, MELANIE ([id number]) on 12/25/2013 3:52:24 PM       Flint MelterElliott L Jeter Tomey, MD 12/26/13 1149

## 2019-08-17 ENCOUNTER — Other Ambulatory Visit: Payer: Self-pay

## 2019-08-17 ENCOUNTER — Emergency Department (HOSPITAL_COMMUNITY): Payer: Self-pay

## 2019-08-17 ENCOUNTER — Encounter (HOSPITAL_COMMUNITY): Payer: Self-pay | Admitting: Emergency Medicine

## 2019-08-17 ENCOUNTER — Emergency Department (HOSPITAL_COMMUNITY)
Admission: EM | Admit: 2019-08-17 | Discharge: 2019-08-18 | Disposition: A | Discharge status: Home / Self Care | Payer: Self-pay | Attending: Emergency Medicine | Admitting: Emergency Medicine

## 2019-08-17 DIAGNOSIS — R509 Fever, unspecified: Secondary | ICD-10-CM | POA: Insufficient documentation

## 2019-08-17 DIAGNOSIS — N5089 Other specified disorders of the male genital organs: Secondary | ICD-10-CM

## 2019-08-17 DIAGNOSIS — R05 Cough: Secondary | ICD-10-CM | POA: Insufficient documentation

## 2019-08-17 DIAGNOSIS — E119 Type 2 diabetes mellitus without complications: Secondary | ICD-10-CM | POA: Insufficient documentation

## 2019-08-17 DIAGNOSIS — N451 Epididymitis: Secondary | ICD-10-CM | POA: Insufficient documentation

## 2019-08-17 LAB — COMPREHENSIVE METABOLIC PANEL
ALT: 11 U/L (ref 0–44)
AST: 11 U/L — ABNORMAL LOW (ref 15–41)
Albumin: 3.4 g/dL — ABNORMAL LOW (ref 3.5–5.0)
Alkaline Phosphatase: 57 U/L (ref 38–126)
Anion gap: 12 (ref 5–15)
BUN: 7 mg/dL (ref 6–20)
CO2: 22 mmol/L (ref 22–32)
Calcium: 8.8 mg/dL — ABNORMAL LOW (ref 8.9–10.3)
Chloride: 100 mmol/L (ref 98–111)
Creatinine, Ser: 0.7 mg/dL (ref 0.61–1.24)
GFR calc Af Amer: >60 mL/min (ref >60)
GFR calc non Af Amer: >60 mL/min (ref >60)
Glucose, Bld: 259 mg/dL — ABNORMAL HIGH (ref 70–99)
Potassium: 3.4 mmol/L — ABNORMAL LOW (ref 3.5–5.1)
Sodium: 134 mmol/L — ABNORMAL LOW (ref 135–145)
Total Bilirubin: 1.8 mg/dL — ABNORMAL HIGH (ref 0.3–1.2)
Total Protein: 6.9 g/dL (ref 6.5–8.1)

## 2019-08-17 LAB — URINALYSIS, ROUTINE W REFLEX MICROSCOPIC
Bacteria, UA: NONE SEEN
Bilirubin Urine: NEGATIVE
Glucose, UA: >=500 mg/dL — AB
Hgb urine dipstick: NEGATIVE
Ketones, ur: 20 mg/dL — AB
Leukocytes,Ua: NEGATIVE
Nitrite: NEGATIVE
Protein, ur: 100 mg/dL — AB
Specific Gravity, Urine: 1.025 (ref 1.005–1.030)
pH: 5 (ref 5.0–8.0)

## 2019-08-17 LAB — CBC WITH DIFFERENTIAL/PLATELET
Abs Immature Granulocytes: 0.09 10*3/uL — ABNORMAL HIGH (ref 0.00–0.07)
Basophils Absolute: 0 10*3/uL (ref 0.0–0.1)
Basophils Relative: 0 %
Eosinophils Absolute: 0 10*3/uL (ref 0.0–0.5)
Eosinophils Relative: 0 %
HCT: 37.9 % — ABNORMAL LOW (ref 39.0–52.0)
Hemoglobin: 13.1 g/dL (ref 13.0–17.0)
Immature Granulocytes: 1 %
Lymphocytes Relative: 15 %
Lymphs Abs: 2.5 10*3/uL (ref 0.7–4.0)
MCH: 30.2 pg (ref 26.0–34.0)
MCHC: 34.6 g/dL (ref 30.0–36.0)
MCV: 87.3 fL (ref 80.0–100.0)
Monocytes Absolute: 1.3 10*3/uL — ABNORMAL HIGH (ref 0.1–1.0)
Monocytes Relative: 8 %
Neutro Abs: 13.2 10*3/uL — ABNORMAL HIGH (ref 1.7–7.7)
Neutrophils Relative %: 76 %
Platelets: 247 10*3/uL (ref 150–400)
RBC: 4.34 MIL/uL (ref 4.22–5.81)
RDW: 11.3 % — ABNORMAL LOW (ref 11.5–15.5)
WBC: 17.3 10*3/uL — ABNORMAL HIGH (ref 4.0–10.5)
nRBC: 0 % (ref 0.0–0.2)

## 2019-08-17 MED ORDER — LEVOFLOXACIN IN D5W 500 MG/100ML IV SOLN
500.0000 mg | Freq: Once | INTRAVENOUS | Status: AC
  Administered 2019-08-18: 01:00:00 500 mg via INTRAVENOUS
  Filled 2019-08-17: qty 100

## 2019-08-17 MED ORDER — KETOROLAC TROMETHAMINE 30 MG/ML IJ SOLN
30.0000 mg | Freq: Once | INTRAMUSCULAR | Status: AC
  Administered 2019-08-18: 30 mg via INTRAVENOUS
  Filled 2019-08-17: qty 1

## 2019-08-17 MED ORDER — ACETAMINOPHEN 325 MG PO TABS
650.0000 mg | ORAL_TABLET | Freq: Once | ORAL | Status: AC
  Administered 2019-08-17: 650 mg via ORAL
  Filled 2019-08-17: qty 2

## 2019-08-17 MED ORDER — SODIUM CHLORIDE 0.9 % IV BOLUS
1000.0000 mL | Freq: Once | INTRAVENOUS | Status: AC
  Administered 2019-08-18: 1000 mL via INTRAVENOUS

## 2019-08-17 NOTE — ED Triage Notes (Signed)
Patient reports bilateral testicular pain /swelling and dysuria onset yesterday , denies injury , no hematuria or penile discharge , febrile at arrival .

## 2019-08-17 NOTE — ED Provider Notes (Signed)
[facility] EMERGENCY DEPARTMENT Provider Note   CSN: [id number] Arrival date & time: 08/17/19  1942     History Chief Complaint  Patient presents with  . Testicle Pain    Andres Graham is a 44 y.o. male who presents to the emergency department with a chief complaint of scrotal pain and swelling for the last 2 days.  The patient reports sudden onset, rapidly worsening pain and swelling to the bilateral scrotum over the last 2 days.  He was found to be febrile in the emergency department, but did not know that he was febrile prior to arrival.  He denies chills, abdominal pain, back pain, nausea, vomiting, diarrhea, rectal pain, hematuria, or penile or testicular pain or swelling, or penile discharge.  He also endorses a slight cough, but denies chest pain, shortness of breath.  No known or suspected COVID-19 contacts.  He reports that he has also been having polyuria, polydipsia, and dysuria for an unknown amount of time, but at least the last few weeks.  Reports that he was diagnosed with diabetes mellitus many years ago, but has not been on any medications "for years" after his blood sugars improved when he lost 60 pounds.  No treatment for symptoms prior to arrival.  He is sexually active with 1 male partner.  No concerns for STIs at this time.  The history is provided by the patient. No language interpreter was used.       Past Medical History:  Diagnosis Date  . Diabetes mellitus without complication (HCC)     There are no problems to display for this patient.   History reviewed. No pertinent surgical history.     No family history on file.  Social History   Tobacco Use  . Smoking status: Never Smoker  Substance Use Topics  . Alcohol use: No  . Drug use: No    Home Medications Prior to Admission medications   Medication Sig Start Date End Date Taking? Authorizing Provider  levofloxacin (LEVAQUIN) 500 MG tablet Take 1 tablet (500 mg total)  by mouth daily. 08/18/19   Zeola Brys A, PA-C  metFORMIN (GLUCOPHAGE) 500 MG tablet Take 1 tablet (500 mg total) by mouth at bedtime. 08/18/19   Kandice Schmelter A, PA-C    Allergies    Penicillins  Review of Systems   Review of Systems  Constitutional: Positive for chills and fever. Negative for appetite change.  Respiratory: Positive for cough. Negative for shortness of breath and wheezing.   Cardiovascular: Negative for chest pain, palpitations and leg swelling.  Gastrointestinal: Negative for abdominal pain, anal bleeding, blood in stool, diarrhea, nausea, rectal pain and vomiting.  Endocrine: Positive for polydipsia and polyuria.  Genitourinary: Positive for dysuria, scrotal swelling and testicular pain. Negative for discharge, flank pain and penile pain.  Musculoskeletal: Negative for back pain.  Skin: Negative for rash.  Allergic/Immunologic: Negative for immunocompromised state.  Neurological: Negative for syncope, weakness and headaches.  Psychiatric/Behavioral: Negative for confusion.    Physical Exam Updated Vital Signs BP 128/69   Pulse (!) 102   Temp 100 F (37.8 C) (Oral) Comment: PA at bedside   Resp 17   Ht 5\' 11"  (1.803 m)   Wt 113.4 kg   SpO2 97%   BMI 34.87 kg/m   Physical Exam Vitals and nursing note reviewed. Exam conducted with a chaperone present.  Constitutional:      Appearance: He is well-developed. He is not toxic-appearing.  HENT:     Head:  Normocephalic.  Eyes:     Conjunctiva/sclera: Conjunctivae normal.  Cardiovascular:     Rate and Rhythm: Regular rhythm. Tachycardia present.     Heart sounds: No murmur.  Pulmonary:     Effort: Pulmonary effort is normal. No respiratory distress.     Breath sounds: No stridor. No wheezing, rhonchi or rales.  Chest:     Chest wall: No tenderness.  Abdominal:     General: There is no distension.     Palpations: Abdomen is soft. There is no mass.     Tenderness: There is no abdominal tenderness. There  is no right CVA tenderness, left CVA tenderness, guarding or rebound.     Hernia: No hernia is present.     Comments: Abdomen is obese, but soft, non-tender, and non-distended.  Genitourinary:    Penis: Normal.      Testes:        Right: Tenderness and swelling present.        Left: Tenderness and swelling present.     Epididymis:     Right: Tenderness present.     Left: Tenderness present.     Comments: Bilateral scrotum is edematous.  No overlying erythema or warmth.  Tender to palpation to the bilateral epididymis bilaterally, left greater than right.  He is tender to palpation in the perineum.  Normal rectal exam.  Normal rectal tone.  No palpable masses. Musculoskeletal:     Cervical back: Neck supple.  Skin:    General: Skin is warm and dry.  Neurological:     Mental Status: He is alert.  Psychiatric:        Behavior: Behavior normal.     ED Results / Procedures / Treatments   Labs (all labs ordered are listed, but only abnormal results are displayed) Labs Reviewed  CBC WITH DIFFERENTIAL/PLATELET - Abnormal; Notable for the following components:      Result Value   WBC 17.3 (*)    HCT 37.9 (*)    RDW 11.3 (*)    Neutro Abs 13.2 (*)    Monocytes Absolute 1.3 (*)    Abs Immature Granulocytes 0.09 (*)    All other components within normal limits  COMPREHENSIVE METABOLIC PANEL - Abnormal; Notable for the following components:   Sodium 134 (*)    Potassium 3.4 (*)    Glucose, Bld 259 (*)    Calcium 8.8 (*)    Albumin 3.4 (*)    AST 11 (*)    Total Bilirubin 1.8 (*)    All other components within normal limits  URINALYSIS, ROUTINE W REFLEX MICROSCOPIC - Abnormal; Notable for the following components:   Glucose, UA >=500 (*)    Ketones, ur 20 (*)    Protein, ur 100 (*)    All other components within normal limits  CULTURE, BLOOD (ROUTINE X 2)  CULTURE, BLOOD (ROUTINE X 2)  URINE CULTURE  LACTIC ACID, PLASMA  LACTIC ACID, PLASMA  HIV ANTIBODY (ROUTINE TESTING W  REFLEX)  GC/CHLAMYDIA PROBE AMP ([place]) NOT AT [facility]    EKG None  Radiology CT ABDOMEN PELVIS W CONTRAST  Result Date: 08/18/2019 CLINICAL DATA:  Bilateral testicular pain, swelling.  Dysuria. EXAM: CT ABDOMEN AND PELVIS WITH CONTRAST TECHNIQUE: Multidetector CT imaging of the abdomen and pelvis was performed using the standard protocol following bolus administration of intravenous contrast. CONTRAST:  OMNIPAQUE IOHEXOL 300 MG/ML  SOLN COMPARISON:  None. FINDINGS: Lower chest: Lung bases are clear. No effusions. Heart is normal size. Hepatobiliary:  Gallstones within the gallbladder. Diffuse fatty infiltration of the liver. No focal hepatic abnormality or biliary ductal dilatation. Pancreas: No focal abnormality or ductal dilatation. Spleen: No focal abnormality.  Normal size. Adrenals/Urinary Tract: No adrenal abnormality. No focal renal abnormality. No stones or hydronephrosis. Urinary bladder is unremarkable. Stomach/Bowel: Normal appendix. Stomach, large and small bowel grossly unremarkable. Vascular/Lymphatic: No evidence of aneurysm or adenopathy. Reproductive: There appears to be scrotal wall edema and possible small hydroceles. Other: No free fluid or free air. Musculoskeletal: No acute bony abnormality. IMPRESSION: Apparent scrotal wall edema.  Probable small hydroceles. Fatty infiltration of the liver. Cholelithiasis. Electronically Signed   By: Charlett Nose M.D.   On: 08/18/2019 01:13   US SCROTUM W/DOPPLER  Result Date: 08/17/2019 CLINICAL DATA:  Initial evaluation for acute scrotal pain and swelling. EXAM: SCROTAL ULTRASOUND DOPPLER ULTRASOUND OF THE TESTICLES TECHNIQUE: Complete ultrasound examination of the testicles, epididymis, and other scrotal structures was performed. Color and spectral Doppler ultrasound were also utilized to evaluate blood flow to the testicles. COMPARISON:  None available. FINDINGS: Right testicle Measurements: 3.2 x 1.6 x 1.9 cm. No mass or  microlithiasis visualized. Left testicle Measurements: 3.6 x 2.0 x 1.9 cm. No mass or microlithiasis visualized. Right epididymis: Right epididymal tail appears heterogeneous and hyperemic, suggesting acute epididymitis. Left epididymis:  Normal in size and appearance. Hydrocele:  None visualized. Varicocele:  None visualized. Pulsed Doppler interrogation of both testes demonstrates normal low resistance arterial and venous waveforms bilaterally. Prominent diffuse scrotal wall thickening and edema with associated hypervascularity, right slightly greater than left. IMPRESSION: 1. Heterogeneous and hyperemic appearance of the right epididymal tail, suggesting acute epididymitis. Diffuse edema and hypervascularity of the overlying scrotal wall suggests associated cellulitis/inflammation. 2. Otherwise negative scrotal ultrasound.  No evidence for torsion. Electronically Signed   By: Rise Mu M.D.   On: 08/17/2019 20:52    Procedures .Critical Care Performed by: Barkley Boards, PA-C Authorized by: Barkley Boards, PA-C   Critical care provider statement:    Critical care time (minutes):  35   Critical care time was exclusive of:  Separately billable procedures and treating other patients and teaching time   Critical care was necessary to treat or prevent imminent or life-threatening deterioration of the following conditions:  Sepsis   Critical care was time spent personally by me on the following activities:  Ordering and performing treatments and interventions, ordering and review of laboratory studies, ordering and review of radiographic studies, pulse oximetry, re-evaluation of patient's condition, review of old charts, obtaining history from patient or surrogate, evaluation of patient's response to treatment, discussions with primary provider, discussions with consultants and development of treatment plan with patient or surrogate   I assumed direction of critical care for this patient from  another provider in my specialty: no     (including critical care time)  Medications Ordered in ED Medications  acetaminophen (TYLENOL) tablet 650 mg (650 mg Oral Given 08/17/19 2005)  sodium chloride 0.9 % bolus 1,000 mL (0 mLs Intravenous Stopped 08/18/19 0304)  ketorolac (TORADOL) 30 MG/ML injection 30 mg (30 mg Intravenous Given 08/18/19 0028)  levofloxacin (LEVAQUIN) IVPB 500 mg (0 mg Intravenous Stopped 08/18/19 0222)  iohexol (OMNIPAQUE) 300 MG/ML solution 125 mL (125 mLs Intravenous Contrast Given 08/18/19 0059)    ED Course  I have reviewed the triage vital signs and the nursing notes.  Pertinent labs & imaging results that were available during my care of the patient were reviewed by me and considered in  my medical decision making (see chart for details).    MDM Rules/Calculators/A&P                      44 year old male with history of currently unmedicated diabetes mellitus for the last few years presenting with 2 days of scrotal pain and swelling.  He also endorses chills and was found to be febrile on arrival to the ER with a fever of 102.2 accompanied by tachycardia.  He also endorses polyuria, polydipsia, and dysuria.  He has had a slight cough, but no other URI symptoms.  No known or suspected COVID-19 contacts.  Tylenol given in the ER and fever resolved.  He has a leukocytosis of 17K with an elevated neutrophil count of 13.2.  Glucose is elevated at 259, but he has a normal anion gap and bicarb.  He is endorsing dysuria, but urine does not appear infectious.  However, he does have significant glucosuria.   Will order blood cultures and lactate.  We will give IV fluids and 1 dose of IV levofloxacin.  He had a scrotal ultrasound that demonstrated heterogeneous and hyperemic appearance of the right epididymal tail, suggestive of acute epididymitis.  There is also diffuse edema and hypervascularity of the overlying scrotal wall suggestive of cellulitis/inflammation.  No evidence  of torsion.  On my exam, the patient had tenderness to the bilateral epididymis as well as significant tenderness in the perineum.  No evidence of cellulitis on my exam.  However, given glucosuria on UA in the setting of sepsis with tenderness in the perineum, will order CT abdomen pelvis to evaluate for Fournier's gangrene.  No obvious abscess noted on rectal exam.  CT abdomen pelvis with probable small hydroceles and apparent scrotal wall edema.  No evidence of Fournier's gangrene.  On reevaluation, vital signs have improved.  Fever has resolved.  Patient reports the pain is much more controlled.  Lactate is normal and thus 30 cc/kg bolus of fluids has not been ordered.  Blood cultures are still pending.  Given the patient's age, consider also treating with Rocephin for gonorrhea and chlamydia.  However, patient has a penicillin allergy.  Chart review and the patient has never been treated with cephalosporins.  Will add on urine GC/chlamydia, but less the source likely given the patient's age.  Will treat with 10 days of Levaquin.  I also recommended starting the patient on Metformin.  He does not currently have a PCP.  Transition of care consult has been placed to help coordinate the patient with establishing a PCP for follow-up.  He has been given strict return precautions to the emergency department. At this time, the patient is hemodynamically stable and appears safe for discharge.    Final Clinical Impression(s) / ED Diagnoses Final diagnoses:  Epididymitis    Rx / DC Orders ED Discharge Orders         Ordered    levofloxacin (LEVAQUIN) 500 MG tablet  Daily     08/18/19 0303    metFORMIN (GLUCOPHAGE) 500 MG tablet  Daily at bedtime     08/18/19 0303           Takeshi Teasdale A, PA-C 08/18/19 0820    Orpah Greek, MD 08/19/19 [phone]

## 2019-08-17 NOTE — ED Notes (Signed)
Pt. Alert and oriented x4. Pain assessed. Repositioned and relaxation encouraged.

## 2019-08-18 ENCOUNTER — Emergency Department (HOSPITAL_COMMUNITY): Payer: Self-pay

## 2019-08-18 LAB — HIV ANTIBODY (ROUTINE TESTING W REFLEX): HIV Screen 4th Generation wRfx: NONREACTIVE

## 2019-08-18 LAB — URINE CULTURE: Culture: NO GROWTH

## 2019-08-18 LAB — LACTIC ACID, PLASMA: Lactic Acid, Venous: 1.5 mmol/L (ref 0.5–1.9)

## 2019-08-18 MED ORDER — METFORMIN HCL 500 MG PO TABS: 500.0000 mg | ORAL_TABLET | Freq: Every day | ORAL | 0 refills | Status: DC

## 2019-08-18 MED ORDER — LEVOFLOXACIN 500 MG PO TABS: 500.0000 mg | ORAL_TABLET | Freq: Every day | ORAL | 0 refills | Status: DC

## 2019-08-18 MED ORDER — IOHEXOL 300 MG/ML  SOLN
125.0000 mL | Freq: Once | INTRAMUSCULAR | Status: AC | PRN
  Administered 2019-08-18: 125 mL via INTRAVENOUS

## 2019-08-18 NOTE — Discharge Instructions (Signed)
Thank you for allowing me to care for you today in the Emergency Department.   You were seen in the emergency department today for pain and swelling to the scrotum.  You were found to have epididymitis.  You are given 1 dose of antibiotics in the ER.   Starting tomorrow morning, take 1 tablet of levofloxacin in the morning and 1 tablet of levofloxacin at night for the next 10 days.  It is important you take the entire course of this medication even if your symptoms significantly improve.  You also need to be restarted on a medication for diabetes.  I have given you a short prescription of Metformin.  For the first week, take 1 tablet of Metformin at bedtime.  After the first week, you can start taking 1 tablet in the morning and 1 tablet at night.  You may receive a call from a social worker to try and get you scheduled for an appointment with your primary care provider to follow-up on your ER visit and to recheck your blood sugar.  For pain, you can apply an ice pack to areas that are painful as needed. Take 650 mg of Tylenol or 600 mg of ibuprofen with food every 6 hours for pain.  You can alternate between these 2 medications every 3 hours if your pain returns.  For instance, you can take Tylenol at noon, followed by a dose of ibuprofen at 3, followed by second dose of Tylenol and 6.  Return to the emergency department if you develop significantly worsening pain or swelling to the scrotum, particularly after taking the antibiotic for 2 days, if you start having warmth and redness or red streaks to the scrotum, if you consistently have fevers despite taking Tylenol and ibuprofen as described above, or if you develop other new, concerning symptoms.

## 2019-08-18 NOTE — ED Notes (Signed)
Patient made aware that we need urine specimen.  

## 2019-08-18 NOTE — ED Notes (Signed)
Pt transferred to CT.

## 2019-08-19 LAB — GC/CHLAMYDIA PROBE AMP (~~LOC~~) NOT AT ARMC
Chlamydia: NEGATIVE
Neisseria Gonorrhea: NEGATIVE

## 2019-08-20 ENCOUNTER — Emergency Department (HOSPITAL_COMMUNITY): Payer: Self-pay

## 2019-08-20 ENCOUNTER — Other Ambulatory Visit: Payer: Self-pay

## 2019-08-20 ENCOUNTER — Inpatient Hospital Stay (HOSPITAL_COMMUNITY)
Admission: EM | Admit: 2019-08-20 | Discharge: 2019-08-23 | DRG: 854 | Disposition: A | Discharge status: Home / Self Care | Payer: Self-pay | Attending: Internal Medicine | Admitting: Internal Medicine

## 2019-08-20 ENCOUNTER — Encounter (HOSPITAL_COMMUNITY)
Admission: EM | Disposition: A | Discharge status: Home / Self Care | Payer: Self-pay | Source: Home / Self Care | Attending: Internal Medicine

## 2019-08-20 ENCOUNTER — Encounter (HOSPITAL_COMMUNITY): Payer: Self-pay | Admitting: Emergency Medicine

## 2019-08-20 ENCOUNTER — Inpatient Hospital Stay (HOSPITAL_COMMUNITY): Payer: Self-pay | Admitting: Anesthesiology

## 2019-08-20 DIAGNOSIS — Z79899 Other long term (current) drug therapy: Secondary | ICD-10-CM

## 2019-08-20 DIAGNOSIS — Z880 Allergy status to penicillin: Secondary | ICD-10-CM

## 2019-08-20 DIAGNOSIS — E876 Hypokalemia: Secondary | ICD-10-CM | POA: Diagnosis not present

## 2019-08-20 DIAGNOSIS — E1165 Type 2 diabetes mellitus with hyperglycemia: Secondary | ICD-10-CM | POA: Diagnosis present

## 2019-08-20 DIAGNOSIS — N451 Epididymitis: Secondary | ICD-10-CM | POA: Diagnosis present

## 2019-08-20 DIAGNOSIS — Z6838 Body mass index (BMI) 38.0-38.9, adult: Secondary | ICD-10-CM

## 2019-08-20 DIAGNOSIS — Z20822 Contact with and (suspected) exposure to covid-19: Secondary | ICD-10-CM | POA: Diagnosis present

## 2019-08-20 DIAGNOSIS — A419 Sepsis, unspecified organism: Principal | ICD-10-CM | POA: Diagnosis present

## 2019-08-20 DIAGNOSIS — L02215 Cutaneous abscess of perineum: Secondary | ICD-10-CM | POA: Diagnosis present

## 2019-08-20 DIAGNOSIS — Z833 Family history of diabetes mellitus: Secondary | ICD-10-CM

## 2019-08-20 DIAGNOSIS — E669 Obesity, unspecified: Secondary | ICD-10-CM | POA: Diagnosis present

## 2019-08-20 DIAGNOSIS — Z7984 Long term (current) use of oral hypoglycemic drugs: Secondary | ICD-10-CM

## 2019-08-20 DIAGNOSIS — I10 Essential (primary) hypertension: Secondary | ICD-10-CM | POA: Diagnosis present

## 2019-08-20 HISTORY — PX: SCROTAL EXPLORATION: SHX2386

## 2019-08-20 LAB — CBC WITH DIFFERENTIAL/PLATELET
Abs Immature Granulocytes: 0.33 10*3/uL — ABNORMAL HIGH (ref 0.00–0.07)
Basophils Absolute: 0.1 10*3/uL (ref 0.0–0.1)
Basophils Relative: 0 %
Eosinophils Absolute: 0.1 10*3/uL (ref 0.0–0.5)
Eosinophils Relative: 0 %
HCT: 38.5 % — ABNORMAL LOW (ref 39.0–52.0)
Hemoglobin: 13.4 g/dL (ref 13.0–17.0)
Immature Granulocytes: 1 %
Lymphocytes Relative: 10 %
Lymphs Abs: 2.3 10*3/uL (ref 0.7–4.0)
MCH: 30.2 pg (ref 26.0–34.0)
MCHC: 34.8 g/dL (ref 30.0–36.0)
MCV: 86.7 fL (ref 80.0–100.0)
Monocytes Absolute: 2.3 10*3/uL — ABNORMAL HIGH (ref 0.1–1.0)
Monocytes Relative: 9 %
Neutro Abs: 19.3 10*3/uL — ABNORMAL HIGH (ref 1.7–7.7)
Neutrophils Relative %: 80 %
Platelets: 286 10*3/uL (ref 150–400)
RBC: 4.44 MIL/uL (ref 4.22–5.81)
RDW: 11.3 % — ABNORMAL LOW (ref 11.5–15.5)
WBC: 24.4 10*3/uL — ABNORMAL HIGH (ref 4.0–10.5)
nRBC: 0 % (ref 0.0–0.2)

## 2019-08-20 LAB — URINALYSIS, ROUTINE W REFLEX MICROSCOPIC
Bacteria, UA: NONE SEEN
Bilirubin Urine: NEGATIVE
Glucose, UA: >=500 mg/dL — AB
Ketones, ur: 80 mg/dL — AB
Leukocytes,Ua: NEGATIVE
Nitrite: NEGATIVE
Protein, ur: 100 mg/dL — AB
Specific Gravity, Urine: 1.026 (ref 1.005–1.030)
pH: 5 (ref 5.0–8.0)

## 2019-08-20 LAB — SURGICAL PCR SCREEN
MRSA, PCR: NEGATIVE
Staphylococcus aureus: NEGATIVE

## 2019-08-20 LAB — COMPREHENSIVE METABOLIC PANEL
ALT: 14 U/L (ref 0–44)
AST: 14 U/L — ABNORMAL LOW (ref 15–41)
Albumin: 3 g/dL — ABNORMAL LOW (ref 3.5–5.0)
Alkaline Phosphatase: 95 U/L (ref 38–126)
Anion gap: 14 (ref 5–15)
BUN: 6 mg/dL (ref 6–20)
CO2: 23 mmol/L (ref 22–32)
Calcium: 8.7 mg/dL — ABNORMAL LOW (ref 8.9–10.3)
Chloride: 94 mmol/L — ABNORMAL LOW (ref 98–111)
Creatinine, Ser: 0.75 mg/dL (ref 0.61–1.24)
GFR calc Af Amer: >60 mL/min (ref >60)
GFR calc non Af Amer: >60 mL/min (ref >60)
Glucose, Bld: 345 mg/dL — ABNORMAL HIGH (ref 70–99)
Potassium: 3.3 mmol/L — ABNORMAL LOW (ref 3.5–5.1)
Sodium: 131 mmol/L — ABNORMAL LOW (ref 135–145)
Total Bilirubin: 1.4 mg/dL — ABNORMAL HIGH (ref 0.3–1.2)
Total Protein: 7.4 g/dL (ref 6.5–8.1)

## 2019-08-20 LAB — GLUCOSE, CAPILLARY
Glucose-Capillary: 247 mg/dL — ABNORMAL HIGH (ref 70–99)
Glucose-Capillary: 235 mg/dL — ABNORMAL HIGH (ref 70–99)
Glucose-Capillary: 256 mg/dL — ABNORMAL HIGH (ref 70–99)

## 2019-08-20 LAB — LACTIC ACID, PLASMA
Lactic Acid, Venous: 1.5 mmol/L (ref 0.5–1.9)
Lactic Acid, Venous: 1.4 mmol/L (ref 0.5–1.9)

## 2019-08-20 LAB — URINE CULTURE: Culture: NO GROWTH

## 2019-08-20 LAB — HEMOGLOBIN A1C
Hgb A1c MFr Bld: 11.8 % — ABNORMAL HIGH (ref 4.8–5.6)
Mean Plasma Glucose: 291.96 mg/dL

## 2019-08-20 LAB — MAGNESIUM: Magnesium: 1.6 mg/dL — ABNORMAL LOW (ref 1.7–2.4)

## 2019-08-20 LAB — RESPIRATORY PANEL BY RT PCR (FLU A&B, COVID)
Influenza A by PCR: NEGATIVE
Influenza B by PCR: NEGATIVE
SARS Coronavirus 2 by RT PCR: NEGATIVE

## 2019-08-20 SURGERY — EXPLORATION, SCROTUM
Anesthesia: General

## 2019-08-20 MED ORDER — OXYCODONE HCL 5 MG PO TABS: 5.0000 mg | ORAL_TABLET | Freq: Once | ORAL | Status: DC | PRN

## 2019-08-20 MED ORDER — FENTANYL CITRATE (PF) 100 MCG/2ML IJ SOLN: 25.0000 ug | INTRAMUSCULAR | Status: DC | PRN

## 2019-08-20 MED ORDER — MEPERIDINE HCL 50 MG/ML IJ SOLN: 6.2500 mg | INTRAMUSCULAR | Status: DC | PRN

## 2019-08-20 MED ORDER — VANCOMYCIN HCL 1000 MG IV SOLR: INTRAVENOUS | Status: DC | PRN

## 2019-08-20 MED ORDER — SODIUM CHLORIDE 0.9 % IV SOLN: INTRAVENOUS | Status: AC

## 2019-08-20 MED ORDER — ACETAMINOPHEN 325 MG PO TABS: 325.0000 mg | ORAL_TABLET | ORAL | Status: DC | PRN

## 2019-08-20 MED ORDER — INSULIN GLARGINE 100 UNIT/ML ~~LOC~~ SOLN
20.0000 [IU] | Freq: Every day | SUBCUTANEOUS | Status: DC
  Administered 2019-08-21 – 2019-08-22 (×3): 20 [IU] via SUBCUTANEOUS
  Filled 2019-08-20 (×5): qty 0.2

## 2019-08-20 MED ORDER — OXYCODONE HCL 5 MG/5ML PO SOLN: 5.0000 mg | Freq: Once | ORAL | Status: DC | PRN

## 2019-08-20 MED ORDER — SODIUM CHLORIDE 0.9 % IV BOLUS
2000.0000 mL | Freq: Once | INTRAVENOUS | Status: AC
  Administered 2019-08-20: 04:00:00 2000 mL via INTRAVENOUS

## 2019-08-20 MED ORDER — OXYCODONE-ACETAMINOPHEN 5-325 MG PO TABS
1.0000 | ORAL_TABLET | Freq: Once | ORAL | Status: AC
  Administered 2019-08-20: 02:00:00 1 via ORAL
  Filled 2019-08-20: qty 1

## 2019-08-20 MED ORDER — ONDANSETRON HCL 4 MG/2ML IJ SOLN
INTRAMUSCULAR | Status: DC | PRN
  Administered 2019-08-20: 4 mg via INTRAVENOUS

## 2019-08-20 MED ORDER — ONDANSETRON HCL 4 MG/2ML IJ SOLN: 4.0000 mg | Freq: Once | INTRAMUSCULAR | Status: DC | PRN

## 2019-08-20 MED ORDER — ONDANSETRON 4 MG PO TBDP
4.0000 mg | ORAL_TABLET | Freq: Once | ORAL | Status: AC
  Administered 2019-08-20: 4 mg via ORAL
  Filled 2019-08-20: qty 1

## 2019-08-20 MED ORDER — PROPOFOL 10 MG/ML IV BOLUS
INTRAVENOUS | Status: DC | PRN
  Administered 2019-08-20: 200 mg via INTRAVENOUS
  Administered 2019-08-20 (×2): 10 mg via INTRAVENOUS
  Administered 2019-08-20 (×2): 20 mg via INTRAVENOUS
  Administered 2019-08-20: 10 mg via INTRAVENOUS

## 2019-08-20 MED ORDER — MORPHINE SULFATE (PF) 2 MG/ML IV SOLN
1.0000 mg | INTRAVENOUS | Status: DC | PRN
  Administered 2019-08-20 – 2019-08-22 (×3): 1 mg via INTRAVENOUS
  Filled 2019-08-20 (×4): qty 1

## 2019-08-20 MED ORDER — CLINDAMYCIN PHOSPHATE 600 MG/50ML IV SOLN
600.0000 mg | Freq: Three times a day (TID) | INTRAVENOUS | Status: DC
  Administered 2019-08-20 – 2019-08-22 (×7): 600 mg via INTRAVENOUS
  Filled 2019-08-20 (×8): qty 50

## 2019-08-20 MED ORDER — ACETAMINOPHEN 10 MG/ML IV SOLN
INTRAVENOUS | Status: DC | PRN
  Administered 2019-08-20: 1000 mg via INTRAVENOUS

## 2019-08-20 MED ORDER — ACETAMINOPHEN 650 MG RE SUPP: 650.0000 mg | Freq: Four times a day (QID) | RECTAL | Status: DC | PRN

## 2019-08-20 MED ORDER — PROPOFOL 10 MG/ML IV BOLUS
INTRAVENOUS | Status: AC
  Filled 2019-08-20: qty 20

## 2019-08-20 MED ORDER — ONDANSETRON HCL 4 MG PO TABS: 4.0000 mg | ORAL_TABLET | Freq: Four times a day (QID) | ORAL | Status: DC | PRN

## 2019-08-20 MED ORDER — FENTANYL CITRATE (PF) 250 MCG/5ML IJ SOLN
INTRAMUSCULAR | Status: AC
  Filled 2019-08-20: qty 5

## 2019-08-20 MED ORDER — LIDOCAINE 2% (20 MG/ML) 5 ML SYRINGE
INTRAMUSCULAR | Status: DC | PRN
  Administered 2019-08-20: 100 mg via INTRAVENOUS

## 2019-08-20 MED ORDER — VANCOMYCIN HCL 1750 MG/350ML IV SOLN
1750.0000 mg | Freq: Two times a day (BID) | INTRAVENOUS | Status: DC
  Administered 2019-08-20 – 2019-08-23 (×6): 1750 mg via INTRAVENOUS
  Filled 2019-08-20 (×7): qty 350

## 2019-08-20 MED ORDER — FENTANYL CITRATE (PF) 100 MCG/2ML IJ SOLN
INTRAMUSCULAR | Status: AC
  Filled 2019-08-20: qty 2

## 2019-08-20 MED ORDER — HYDROMORPHONE HCL 1 MG/ML IJ SOLN
0.5000 mg | INTRAMUSCULAR | Status: DC | PRN
  Administered 2019-08-21: 1 mg via INTRAVENOUS
  Filled 2019-08-20: qty 1

## 2019-08-20 MED ORDER — ACETAMINOPHEN 160 MG/5ML PO SOLN: 325.0000 mg | ORAL | Status: DC | PRN

## 2019-08-20 MED ORDER — MUPIROCIN 2 % EX OINT
1.0000 "application " | TOPICAL_OINTMENT | Freq: Two times a day (BID) | CUTANEOUS | Status: DC
  Administered 2019-08-20 – 2019-08-21 (×3): 1 via NASAL
  Filled 2019-08-20 (×2): qty 22

## 2019-08-20 MED ORDER — LACTATED RINGERS IV SOLN: INTRAVENOUS | Status: DC | PRN

## 2019-08-20 MED ORDER — SODIUM CHLORIDE 0.9 % IV SOLN
2.0000 g | Freq: Once | INTRAVENOUS | Status: AC
  Administered 2019-08-20: 16:00:00 2 g via INTRAVENOUS
  Filled 2019-08-20: qty 20

## 2019-08-20 MED ORDER — BUPIVACAINE HCL 0.25 % IJ SOLN
INTRAMUSCULAR | Status: AC
  Filled 2019-08-20: qty 1

## 2019-08-20 MED ORDER — SODIUM CHLORIDE 0.9 % IV SOLN: INTRAVENOUS | Status: DC | PRN

## 2019-08-20 MED ORDER — VANCOMYCIN HCL 2000 MG/400ML IV SOLN
2000.0000 mg | Freq: Once | INTRAVENOUS | Status: AC
  Administered 2019-08-20: 05:00:00 2000 mg via INTRAVENOUS
  Filled 2019-08-20: qty 400

## 2019-08-20 MED ORDER — ONDANSETRON HCL 4 MG/2ML IJ SOLN
4.0000 mg | Freq: Four times a day (QID) | INTRAMUSCULAR | Status: DC | PRN
  Administered 2019-08-20: 4 mg via INTRAVENOUS
  Filled 2019-08-20: qty 2

## 2019-08-20 MED ORDER — IOHEXOL 300 MG/ML  SOLN
100.0000 mL | Freq: Once | INTRAMUSCULAR | Status: AC | PRN
  Administered 2019-08-20: 05:00:00 100 mL via INTRAVENOUS

## 2019-08-20 MED ORDER — 0.9 % SODIUM CHLORIDE (POUR BTL) OPTIME
TOPICAL | Status: DC | PRN
  Administered 2019-08-20: 1000 mL

## 2019-08-20 MED ORDER — INSULIN ASPART 100 UNIT/ML ~~LOC~~ SOLN
0.0000 [IU] | SUBCUTANEOUS | Status: DC
  Administered 2019-08-20: 12:00:00 3 [IU] via SUBCUTANEOUS
  Administered 2019-08-20: 5 [IU] via SUBCUTANEOUS
  Administered 2019-08-20: 09:00:00 3 [IU] via SUBCUTANEOUS
  Administered 2019-08-21 (×2): 2 [IU] via SUBCUTANEOUS
  Administered 2019-08-21: 3 [IU] via SUBCUTANEOUS
  Administered 2019-08-21: 2 [IU] via SUBCUTANEOUS

## 2019-08-20 MED ORDER — ACETAMINOPHEN 10 MG/ML IV SOLN
INTRAVENOUS | Status: AC
  Filled 2019-08-20: qty 100

## 2019-08-20 MED ORDER — BUPIVACAINE HCL (PF) 0.25 % IJ SOLN
INTRAMUSCULAR | Status: DC | PRN
  Administered 2019-08-20: 17 mL

## 2019-08-20 MED ORDER — HYDRALAZINE HCL 20 MG/ML IJ SOLN: 10.0000 mg | INTRAMUSCULAR | Status: DC | PRN

## 2019-08-20 MED ORDER — POTASSIUM CHLORIDE 10 MEQ/100ML IV SOLN
10.0000 meq | INTRAVENOUS | Status: AC
  Administered 2019-08-20 (×2): 10 meq via INTRAVENOUS
  Filled 2019-08-20: qty 100

## 2019-08-20 MED ORDER — FENTANYL CITRATE (PF) 100 MCG/2ML IJ SOLN
100.0000 ug | Freq: Once | INTRAMUSCULAR | Status: AC
  Administered 2019-08-20: 05:00:00 100 ug via INTRAVENOUS
  Filled 2019-08-20: qty 2

## 2019-08-20 MED ORDER — MIDAZOLAM HCL 5 MG/5ML IJ SOLN
INTRAMUSCULAR | Status: DC | PRN
  Administered 2019-08-20 (×2): 1 mg via INTRAVENOUS

## 2019-08-20 MED ORDER — ACETAMINOPHEN 325 MG PO TABS
650.0000 mg | ORAL_TABLET | Freq: Four times a day (QID) | ORAL | Status: DC | PRN
  Administered 2019-08-21: 650 mg via ORAL
  Filled 2019-08-20 (×2): qty 2

## 2019-08-20 MED ORDER — SODIUM CHLORIDE 0.9 % IV SOLN
2.0000 g | INTRAVENOUS | Status: DC
  Administered 2019-08-21 – 2019-08-22 (×2): 2 g via INTRAVENOUS
  Filled 2019-08-20: qty 2
  Filled 2019-08-20: qty 20

## 2019-08-20 MED ORDER — FENTANYL CITRATE (PF) 100 MCG/2ML IJ SOLN
INTRAMUSCULAR | Status: DC | PRN
  Administered 2019-08-20 (×3): 50 ug via INTRAVENOUS
  Administered 2019-08-20: 100 ug via INTRAVENOUS
  Administered 2019-08-20 (×2): 50 ug via INTRAVENOUS

## 2019-08-20 MED ORDER — MIDAZOLAM HCL 2 MG/2ML IJ SOLN
INTRAMUSCULAR | Status: AC
  Filled 2019-08-20: qty 2

## 2019-08-20 SURGICAL SUPPLY — 36 items
BLADE HEX COATED 2.75 (ELECTRODE) ×3 IMPLANT
BNDG ELASTIC 4X5.8 VLCR STR LF (GAUZE/BANDAGES/DRESSINGS) ×2 IMPLANT
BNDG GAUZE ELAST 4 BULKY (GAUZE/BANDAGES/DRESSINGS) ×3 IMPLANT
COVER SURGICAL LIGHT HANDLE (MISCELLANEOUS) ×3 IMPLANT
COVER WAND RF STERILE (DRAPES) IMPLANT
DERMABOND ADVANCED (GAUZE/BANDAGES/DRESSINGS) ×2
DERMABOND ADVANCED .7 DNX12 (GAUZE/BANDAGES/DRESSINGS) ×1 IMPLANT
DRAIN PENROSE 0.25X18 (DRAIN) ×5 IMPLANT
DRAPE LAPAROTOMY T 98X78 PEDS (DRAPES) ×3 IMPLANT
DRSG PAD ABDOMINAL 8X10 ST (GAUZE/BANDAGES/DRESSINGS) ×2 IMPLANT
ELECT REM PT RETURN 15FT ADLT (MISCELLANEOUS) ×3 IMPLANT
GAUZE PACKING 1 X5 YD ST (GAUZE/BANDAGES/DRESSINGS) ×2 IMPLANT
GLOVE BIOGEL M 8.0 STRL (GLOVE) ×3 IMPLANT
GOWN STRL REUS W/ TWL XL LVL3 (GOWN DISPOSABLE) ×1 IMPLANT
GOWN STRL REUS W/TWL XL LVL3 (GOWN DISPOSABLE) ×5 IMPLANT
KIT BASIN OR (CUSTOM PROCEDURE TRAY) ×3 IMPLANT
KIT TURNOVER KIT A (KITS) IMPLANT
NEEDLE HYPO 22GX1.5 SAFETY (NEEDLE) ×2 IMPLANT
NS IRRIG 1000ML POUR BTL (IV SOLUTION) ×3 IMPLANT
PACK GENERAL/GYN (CUSTOM PROCEDURE TRAY) ×3 IMPLANT
PENCIL SMOKE EVACUATOR (MISCELLANEOUS) IMPLANT
SUPPORT SCROTAL LG STRP (MISCELLANEOUS) ×2 IMPLANT
SUPPORTER ATHLETIC LG (MISCELLANEOUS) ×1
SUT CHROMIC 3 0 SH 27 (SUTURE) ×3 IMPLANT
SUT CHROMIC 4 0 SH 27 (SUTURE) IMPLANT
SUT MNCRL AB 4-0 PS2 18 (SUTURE) IMPLANT
SUT PROLENE 4 0 RB 1 (SUTURE) ×2
SUT PROLENE 4-0 RB1 .5 CRCL 36 (SUTURE) ×1 IMPLANT
SUT SILK 2 0 30  PSL (SUTURE) ×2
SUT SILK 2 0 30 PSL (SUTURE) IMPLANT
SUT VIC AB 2-0 UR5 27 (SUTURE) IMPLANT
SUT VIC AB 4-0 PS2 27 (SUTURE) ×3 IMPLANT
SUT VICRYL 0 TIES 12 18 (SUTURE) ×3 IMPLANT
SYR CONTROL 10ML LL (SYRINGE) IMPLANT
TOWEL OR 17X26 10 PK STRL BLUE (TOWEL DISPOSABLE) ×3 IMPLANT
WATER STERILE IRR 1000ML POUR (IV SOLUTION) ×3 IMPLANT

## 2019-08-20 NOTE — ED Provider Notes (Addendum)
[facility] EMERGENCY DEPARTMENT Provider Note   CSN: [id number] Arrival date & time: 08/20/19  0124     History Chief Complaint  Patient presents with  . Testicular Pain with Swelling    Andres Graham is a 44 y.o. male with a history of diabetes mellitus type 2 who presents to the emergency department with a chief complaint of worsening scrotal pain and swelling.  The patient was seen in the ER for the same on 3/21.  Scrotal ultrasound demonstrated epididymitis.  Given uncontrolled hyperglycemia, CT abdomen pelvis was ordered to assess for Fournier's gangrene, which was negative.  The patient was started on Levaquin and was given a dose in the ER.  He was also started on Metformin and provided with a primary care referral for follow-up.  He has been compliant with Levaquin and Metformin, but reports that his scrotum has continued to swell and is now draining.  Pain is constant and worsening.  Noted aggravating or alleviating factors.  He reports that the following day that he developed vomiting and nausea.  Reports that he has been unable to keep down any food or fluids over the last few days.    He denies fever, chills, rectal pain, dysuria, abdominal pain, or back pain.  He has not had a bowel movement since he was seen in the ER.  Patient has a history of diabetes mellitus, but was able to stop his home medications approximately 15 years ago after he lost weight.  The history is provided by the patient. No language interpreter was used.       Past Medical History:  Diagnosis Date  . Diabetes mellitus without complication [facility])     Patient Active Problem List   Diagnosis Date Noted  . Sepsis (HCC) 08/20/2019  . Perineal abscess 08/20/2019  . Uncontrolled type 2 diabetes mellitus with hyperglycemia (HCC) 08/20/2019    History reviewed. No pertinent surgical history.     Family History  Problem Relation Age of Onset  . Diabetes Mellitus II Father      Social History   Tobacco Use  . Smoking status: Never Smoker  . Smokeless tobacco: Never Used  Substance Use Topics  . Alcohol use: No  . Drug use: No    Home Medications Prior to Admission medications   Medication Sig Start Date End Date Taking? Authorizing Provider  levofloxacin (LEVAQUIN) 500 MG tablet Take 1 tablet (500 mg total) by mouth daily. 08/18/19  Yes Dora Simeone A, PA-C  metFORMIN (GLUCOPHAGE) 500 MG tablet Take 1 tablet (500 mg total) by mouth at bedtime. 08/18/19  Yes Yovany Clock A, PA-C    Allergies    Penicillins  Review of Systems   Review of Systems  Constitutional: Negative for appetite change, chills, diaphoresis and fever.  HENT: Negative for congestion, sinus pressure, sinus pain and sore throat.   Respiratory: Negative for cough, shortness of breath and wheezing.   Cardiovascular: Negative for chest pain.  Gastrointestinal: Positive for constipation, nausea and vomiting. Negative for abdominal pain and diarrhea.  Genitourinary: Positive for scrotal swelling. Negative for dysuria, penile pain and penile swelling.  Musculoskeletal: Negative for back pain.  Skin: Negative for rash.  Allergic/Immunologic: Negative for immunocompromised state.  Neurological: Negative for weakness, light-headedness, numbness and headaches.  Psychiatric/Behavioral: Negative for confusion.    Physical Exam Updated Vital Signs BP (!) 139/91   Pulse (!) 103   Temp 98.4 F (36.9 C) (Oral)   Resp 13   Ht 5\' 11"  (  1.803 m)   Wt 125 kg   SpO2 100%   BMI 38.43 kg/m   Physical Exam Vitals and nursing note reviewed.  Constitutional:      General: He is not in acute distress.    Appearance: He is well-developed. He is not ill-appearing, toxic-appearing or diaphoretic.     Comments: Uncomfortable appearing  HENT:     Head: Normocephalic.  Eyes:     Conjunctiva/sclera: Conjunctivae normal.  Cardiovascular:     Rate and Rhythm: Regular rhythm. Tachycardia present.      Pulses: Normal pulses.     Heart sounds: Normal heart sounds. No murmur. No friction rub. No gallop.   Pulmonary:     Effort: Pulmonary effort is normal. No respiratory distress.     Breath sounds: No stridor. No wheezing, rhonchi or rales.  Chest:     Chest wall: No tenderness.  Abdominal:     General: There is no distension.     Palpations: Abdomen is soft. There is no mass.     Tenderness: There is no abdominal tenderness. There is no right CVA tenderness, left CVA tenderness, guarding or rebound.     Hernia: No hernia is present.  Genitourinary:    Comments: Chaperoned exam.  The scrotum is edematous and firm.  No crepitus.  He is also significantly tender over the posterior scrotum where there is a draining area with purulence.  No tenderness to the perineum.  No significant inguinal lymphadenopathy. Musculoskeletal:     Cervical back: Neck supple.  Skin:    General: Skin is warm and dry.  Neurological:     Mental Status: He is alert.  Psychiatric:        Behavior: Behavior normal.     ED Results / Procedures / Treatments   Labs (all labs ordered are listed, but only abnormal results are displayed) Labs Reviewed  CBC WITH DIFFERENTIAL/PLATELET - Abnormal; Notable for the following components:      Result Value   WBC 24.4 (*)    HCT 38.5 (*)    RDW 11.3 (*)    Neutro Abs 19.3 (*)    Monocytes Absolute 2.3 (*)    Abs Immature Granulocytes 0.33 (*)    All other components within normal limits  COMPREHENSIVE METABOLIC PANEL - Abnormal; Notable for the following components:   Sodium 131 (*)    Potassium 3.3 (*)    Chloride 94 (*)    Glucose, Bld 345 (*)    Calcium 8.7 (*)    Albumin 3.0 (*)    AST 14 (*)    Total Bilirubin 1.4 (*)    All other components within normal limits  URINALYSIS, ROUTINE W REFLEX MICROSCOPIC - Abnormal; Notable for the following components:   Glucose, UA >=500 (*)    Hgb urine dipstick SMALL (*)    Ketones, ur 80 (*)    Protein, ur 100  (*)    All other components within normal limits  CULTURE, BLOOD (ROUTINE X 2)  RESPIRATORY PANEL BY RT PCR (FLU A&B, COVID)  CULTURE, BLOOD (ROUTINE X 2)  URINE CULTURE  LACTIC ACID, PLASMA  LACTIC ACID, PLASMA    EKG None  Radiology CT PELVIS W CONTRAST  Result Date: 08/20/2019 CLINICAL DATA:  Peroneal abscess EXAM: CT PELVIS WITH CONTRAST TECHNIQUE: Multidetector CT imaging of the pelvis was performed using the standard protocol following the bolus administration of intravenous contrast. CONTRAST:  100mL OMNIPAQUE IOHEXOL 300 MG/ML  SOLN COMPARISON:  Scrotal ultrasound same day,  CT March 22nd 2021 FINDINGS: Urinary Tract: The visualized distal ureters and bladder appear unremarkable. Bowel: No bowel wall thickening, distention or surrounding inflammation identified within the pelvis. Scattered colonic diverticular true. Vascular/Lymphatic: No enlarged pelvic lymph nodes identified. No significant vascular findings. Reproductive: The prostate is unremarkable. Other: Within the left anterior perineal soft tissues there is a loculated fluid collection which measures approximately 4.3 x 1.6 x 4.6 cm. The loculated fluid collection extends into the anterior left scrotal soft tissues. There is diffuse bilateral scrotal wall edema and non loculated fluid with skin thickening. No subcutaneous emphysema seen. Musculoskeletal: No acute or significant osseous findings. IMPRESSION: Loculated abscess seen within the anterior left perineal soft tissues measuring 4.3 x 1.6 x 4.6 cm. There is extensive subcutaneous edema skin thickening and non loculated fluid extending into the bilateral scrotal sacs. No subcutaneous emphysema. Electronically Signed   By: Prudencio Pair M.D.   On: 08/20/2019 05:31   US Scrotum  Result Date: 08/20/2019 CLINICAL DATA:  Scrotal swelling and pain EXAM: ULTRASOUND OF SCROTUM TECHNIQUE: Complete ultrasound examination of the testicles, epididymis, and other scrotal structures was  performed. COMPARISON:  August 17, 2019 FINDINGS: Right testicle Measurements: 3.2 x 1.9 x 2.1 cm. There is scrotal wall subcutaneous edema and skin thickening. No mass or microlithiasis visualized. Left testicle Measurements: 3.4 x 1.8 x 2.0 cm. There is scrotal wall subcutaneous edema and skin thickening. No mass or microlithiasis visualized. Right epididymis:  Normal in size and appearance. Left epididymis:  Normal in size and appearance. Hydrocele:  There is a trace left hydrocele present. Varicocele:  None visualized. IMPRESSION: Bilateral scrotal wall edema and skin thickening. No definite scrotal sac fluid collection seen. Trace left hydrocele present. Partially visualized perineal soft tissue abscess. Electronically Signed   By: Prudencio Pair M.D.   On: 08/20/2019 04:35    Procedures .Critical Care Performed by: Joanne Gavel, PA-C Authorized by: Joanne Gavel, PA-C   Critical care provider statement:    Critical care time (minutes):  45   Critical care time was exclusive of:  Separately billable procedures and treating other patients and teaching time   Critical care was necessary to treat or prevent imminent or life-threatening deterioration of the following conditions:  Sepsis and metabolic crisis   Critical care was time spent personally by me on the following activities:  Ordering and performing treatments and interventions, ordering and review of laboratory studies, ordering and review of radiographic studies, pulse oximetry, re-evaluation of patient's condition, obtaining history from patient or surrogate, examination of patient, evaluation of patient's response to treatment, development of treatment plan with patient or surrogate, discussions with consultants and review of old charts   I assumed direction of critical care for this patient from another provider in my specialty: no     (including critical care time)  Medications Ordered in ED Medications  vancomycin (VANCOREADY)  IVPB 1750 mg/350 mL (has no administration in time range)  cefTRIAXone (ROCEPHIN) 2 g in sodium chloride 0.9 % 100 mL IVPB (has no administration in time range)  clindamycin (CLEOCIN) IVPB 600 mg (has no administration in time range)  acetaminophen (TYLENOL) tablet 650 mg (has no administration in time range)    Or  acetaminophen (TYLENOL) suppository 650 mg (has no administration in time range)  ondansetron (ZOFRAN) tablet 4 mg (has no administration in time range)    Or  ondansetron (ZOFRAN) injection 4 mg (has no administration in time range)  insulin aspart (novoLOG) injection 0-9 Units (has  no administration in time range)  0.9 %  sodium chloride infusion (has no administration in time range)  cefTRIAXone (ROCEPHIN) 2 g in sodium chloride 0.9 % 100 mL IVPB (has no administration in time range)  hydrALAZINE (APRESOLINE) injection 10 mg (has no administration in time range)  oxyCODONE-acetaminophen (PERCOCET/ROXICET) 5-325 MG per tablet 1 tablet (1 tablet Oral Given 08/20/19 0140)  ondansetron (ZOFRAN-ODT) disintegrating tablet 4 mg (4 mg Oral Given 08/20/19 0244)  sodium chloride 0.9 % bolus 2,000 mL (2,000 mLs Intravenous New Bag/Given 08/20/19 0421)  vancomycin (VANCOREADY) IVPB 2000 mg/400 mL (2,000 mg Intravenous New Bag/Given 08/20/19 0504)  fentaNYL (SUBLIMAZE) injection 100 mcg (100 mcg Intravenous Given 08/20/19 0459)  iohexol (OMNIPAQUE) 300 MG/ML solution 100 mL (100 mLs Intravenous Contrast Given 08/20/19 0505)    ED Course  I have reviewed the triage vital signs and the nursing notes.  Pertinent labs & imaging results that were available during my care of the patient were reviewed by me and considered in my medical decision making (see chart for details).    MDM Rules/Calculators/A&P                      44 year old male with a history of diabetes mellitus who is known to me as I cared for him in the ER on 3/21 was he was seen and evaluated and diagnosed with epididymitis.  He  had hyperglycemia that improved with fluids and he was started on Metformin and was given an outpatient primary care follow-up.  He was discharged with Levaquin for epididymitis.  He returns today with vomiting for the last few days and significantly worsening scrotal pain and swelling with new drainage.  On exam, there is purulent drainage from the posterior scrotum.  Scrotum is significantly more edematous and firm today as compared to his last ER visit.  No constitutional symptoms.  He has had multiple episodes of vomiting over the last few days, and has concentrated urine with ketones.  IV fluids given.  Nausea resolved after Zofran.  Leukocytosis has increased to 24, up from 17 on his last ER visit.  Lactate is normal.  Glucose is elevated at 345, but doubt DKA given normal anion gap and bicarb.  We will start the patient on vancomycin after speaking with pharmacy regarding the patient's historic penicillin allergy.  He is unsure of his reaction to penicillin.  He has no history of receiving cephalosporins and are medical record.  Scrotal ultrasound demonstrating bilateral scrotal wall edema and skin thickening.  There is a partially visualized perineal soft tissue abscess.  Given uncontrolled hyperglycemia and new abscess in the perineum, will repeat CT of the pelvis to ensure there is no early developing Fournier's gangrene.  CT demonstrating loculated abscess within the anterior left perennial soft tissues with extensive subcutaneous edema thickening and nonloculated fluid extending into the bilateral scrotal sacs.  Initially spoke with Dr. Doylene Canard with general surgery for I&D, but she recommends consulting urology.  Spoke with Dr. Marlou Porch with urology who recommends transferring the patient to [facility] since he is being admitted.  He will see the patient later today with likely I&D either today or tomorrow.  He also recommends starting the patient on Rocephin.  2 g of Rocephin has been ordered and  patient will be monitored for allergic reaction.  Patient's pain is well controlled on fentanyl at this time.  He is currently stable.  Consult to the hospitalist team and Dr. Toniann Fail will accept the patient  for admission.   Final Clinical Impression(s) / ED Diagnoses Final diagnoses:  None    Rx / DC Orders ED Discharge Orders    None       Barkley Boards, PA-C 08/20/19 0742    Austine Wiedeman A, PA-C 08/20/19 4373    Zadie Rhine, MD 08/21/19 [phone]

## 2019-08-20 NOTE — Op Note (Signed)
Preoperative diagnosis:  1. Perineal abscess   Postoperative diagnosis:  1. Scrotal/perineal abscess   Procedure: 1. Peroneal abscess 2. Scrotal exploration  Surgeon: Crist Fat, MD  Anesthesia: General  Complications: None  Intraoperative findings:  #1: The patient had an abscess in the perineum and infection extending up into the left hemiscrotum which was all drained. #2: I left Betadine soaked packing in the perineal space and closed the skin loosely over pulling the packing out through the openings between the sutures. #3: I placed Penrose drains, quarter-inch, in both hemiscrotum's and pulled those out through the perineal wound.  EBL: 50cc  Specimens: Aerobic and anaerobic cultures from the perineal wound  Indication: Andres Graham is a 44 y.o. patient with infected perineal abscess and scrotal swelling/infection.  After reviewing the management options for treatment, he elected to proceed with the above surgical procedure(s). We have discussed the potential benefits and risks of the procedure, side effects of the proposed treatment, the likelihood of the patient achieving the goals of the procedure, and any potential problems that might occur during the procedure or recuperation. Informed consent has been obtained.  Description of procedure:  The patient was taken to the operating room and general anesthesia was induced.  The patient was placed in the dorsal lithotomy position, prepped and draped in the usual sterile fashion, and preoperative antibiotics were administered. A preoperative time-out was performed.   I injected 15 cc of quarter percent Marcaine into the infected area.  I then opened up the perineal wound and extended the incision up into the posterior scrotum.  I then opened up the loculations with a Tresa Endo.  I then opened up each hemiscrotum separately and finger dissected up into that space ensuring that there are no additional loculations and draining  the each hemiscrotum.  There was some purulence in the left hemiscrotum.  I placed Penrose drains, quarter-inch up into the hemiscrotum bilaterally and pulled them through the perineal wound securing them with the Prolene.  I then irrigated the perineal wound with saline and obtained hemostasis.  I debrided some of the tissue in that space.  I subsequently used a 2-0 silk and performed vertical mattress sutures loosely closing the wound with packing inserted in and around the space between the sutures.  We then put a ABD and Kerlix dressing to support the area and placed the patient in some mesh underpants.  He was subsequently extubated return the PACU in stable condition.  Crist Fat, M.D.

## 2019-08-20 NOTE — Progress Notes (Signed)
PROGRESS NOTE  Andres Graham  DOB: [birth year]  PCP: Patient, No Pcp Per ERX:[id number]  DOA: 08/20/2019 Admitted From: Home  LOS: 0 days   Chief Complaint  Patient presents with  . Testicular Pain with Swelling   Brief narrative: Patient is a 44 year old male with history of type 2 diabetes mellitus previously on medication,, not on 8 years now after weight loss.   3/21, patient presented to the ED with complaint of scrotal pain.  CT scan at that time showed only some scrotal wall edema, he was diagnosed with epididymitis and discharged home on Levaquin.  During that visit patient blood sugar was found to be elevated and was discharged on Metformin.  At home, patient started noticing puslike discharge from the perineal area.  The pain and swelling in the area continued to worsen and hence he returned to the ED on 3/23.  In the ED, patient was afebrile, tachycardic to 110s, hypertensive to 160s, breathing comfortably in room air. Blood work showed WBC count elevated to 24.4, lactic acid 1.5, sodium 139, potassium 3.3 glucose level 345, A1c 11.8. CT abdomen pelvis showed perineal abscess 4.3 x 4.6 cm in size.  No evidence of Fournier's gangrene.  He was given IV Rocephin, IV vancomycin and IV clindamycin. Urologist was consulted.  Patient was admitted to hospitalist service.   Subjective: Patient was seen and examined this morning.  Young African-American male.  Lying down in bed.  Not in distress.  Pain controlled.  His wife was at bedside.  Assessment/Plan: Perineal/scrotal abscess Sepsis - POA -CT scan imaging as above.  No evidence of testicular involvement or Fournier's gangrene. -General surgery consult appreciated.  Patient is planned for I&D today at OR. -Continue IV pain medicine for pain control. -Met sepsis criteria on admission with tachycardia, leukocytosis, source of infection. -Continue broad-spectrum antibiotics, IV fluid. -Follow-up culture report.  Diabetes  mellitus type 2 -A1c 11.8 on 3/24 -Reportedly, patient was on medicine in the past and he did not require it after weight loss. -I will start the patient on Lantus 20 units nightly and sliding scale insulin for now. -Need further adjustment as well as oral pills initiated prior to discharge.  Elevated blood pressure -No documented history of hypertension, however, his blood pressure is currently running mostly 150s.  May be also related to pain.  Continue to monitor. -May need oral medicine started for elevated blood pressure.  Obesity - Body mass index is 38.43 kg/m. Patient has been advised to make an attempt to improve diet and exercise patterns to aid in weight loss.  Code Status:  Full code DVT prophylaxis:  SCDs for now. Antimicrobials:  IV vancomycin, IV clindamycin and IV Rocephin. Fluid: Normal saline 125 ml/h Diet: Currently n.p.o. for surgery.  Diabetic diet after that. Mobility: Encourage ambulation Family Communication:  Spoke with wife at bedside this morning Discharge plan:  Anticipated date and disposition: Continue inpatient management for next 2 to 3 days Barriers: Ongoing surgery today  Consultants:  General surgery  Antimicrobials: Anti-infectives (From admission, onward)   Start     Dose/Rate Route Frequency Ordered Stop   08/21/19 0600  [MAR Hold]  cefTRIAXone (ROCEPHIN) 2 g in sodium chloride 0.9 % 100 mL IVPB     (MAR Hold since Wed 08/20/2019 at 1544.Hold Reason: Transfer to a Procedural area.)   2 g 200 mL/hr over 30 Minutes Intravenous Every 24 hours 08/20/19 0625     08/20/19 1600  [MAR Hold]  vancomycin (VANCOREADY) IVPB 1750 mg/350 mL     (  MAR Hold since Wed 08/20/2019 at 1544.Hold Reason: Transfer to a Procedural area.)   1,750 mg 175 mL/hr over 120 Minutes Intravenous Every 12 hours 08/20/19 0410     08/20/19 0630  [MAR Hold]  clindamycin (CLEOCIN) IVPB 600 mg     (MAR Hold since Wed 08/20/2019 at 1544.Hold Reason: Transfer to a Procedural area.)    600 mg 100 mL/hr over 30 Minutes Intravenous Every 8 hours 08/20/19 0620     08/20/19 0615  [MAR Hold]  cefTRIAXone (ROCEPHIN) 2 g in sodium chloride 0.9 % 100 mL IVPB     (MAR Hold since Wed 08/20/2019 at 1544.Hold Reason: Transfer to a Procedural area.)   2 g 200 mL/hr over 30 Minutes Intravenous  Once 08/20/19 0605     08/20/19 0415  vancomycin (VANCOREADY) IVPB 2000 mg/400 mL     2,000 mg 200 mL/hr over 120 Minutes Intravenous  Once 08/20/19 0404 08/20/19 0754        Code Status: Full Code   Diet Order            Diet NPO time specified  Diet effective now              Infusions:  . sodium chloride 125 mL/hr at 08/20/19 0919  . [MAR Hold] cefTRIAXone (ROCEPHIN)  IV    . [MAR Hold] cefTRIAXone (ROCEPHIN)  IV    . [MAR Hold] clindamycin (CLEOCIN) IV 600 mg (08/20/19 1413)  . [MAR Hold] vancomycin      Scheduled Meds: . [MAR Hold] insulin aspart  0-9 Units Subcutaneous Q4H  . insulin glargine  20 Units Subcutaneous QHS  . [MAR Hold] mupirocin ointment  1 application Nasal BID    PRN meds: [MAR Hold] acetaminophen **OR** [MAR Hold] acetaminophen, [MAR Hold] hydrALAZINE, [MAR Hold]  morphine injection, [MAR Hold] ondansetron **OR** [MAR Hold] ondansetron (ZOFRAN) IV   Objective: Vitals:   08/20/19 0727 08/20/19 0801  BP:  (!) 160/105  Pulse:  (!) 106  Resp:  20  Temp: 98.4 F (36.9 C) 99 F (37.2 C)  SpO2:  100%   No intake or output data in the 24 hours ending 08/20/19 1547 Filed Weights   08/20/19 0133  Weight: 125 kg   Weight change:  Body mass index is 38.43 kg/m.   Physical Exam: General exam: Appears calm and comfortable. partially sedated from pain medicine Skin: No rashes, lesions or ulcers. HEENT: Atraumatic, normocephalic, supple neck, no obvious bleeding Lungs: Clear to auscultation bilaterally CVS: Regular rate and rhythm, no murmur GI/Abd soft, nontender, nondistended, bowel sound present CNS: Alert, awake, oriented x3 Psychiatry: Mood  appropriate Extremities: No pedal edema, no calf tenderness  Data Review: I have personally reviewed the laboratory data and studies available.  Recent Labs  Lab 08/17/19 2005 08/20/19 0141  WBC 17.3* 24.4*  NEUTROABS 13.2* 19.3*  HGB 13.1 13.4  HCT 37.9* 38.5*  MCV 87.3 86.7  PLT 247 286   Recent Labs  Lab 08/17/19 2005 08/20/19 0141 08/20/19 0912  NA 134* 131*  --   K 3.4* 3.3*  --   CL 100 94*  --   CO2 22 23  --   GLUCOSE 259* 345*  --   BUN 7 6  --   CREATININE 0.70 0.75  --   CALCIUM 8.8* 8.7*  --   MG  --   --  1.6*    Signed, Terrilee Croak, MD [facility] Pager: [phone] (Secure Chat preferred). 08/20/2019

## 2019-08-20 NOTE — Anesthesia Postprocedure Evaluation (Signed)
Anesthesia Post Note  Patient: Physicist, medical  Procedure(s) Performed: PERINEAL ABSCESS INCISION AND DRAINAGE/ SCROTUM EXPLORATION (N/A )     Anesthesia Type: General Level of consciousness: awake Pain management: pain level controlled Vital Signs Assessment: post-procedure vital signs reviewed and stable Respiratory status: spontaneous breathing Cardiovascular status: stable Postop Assessment: no apparent nausea or vomiting Anesthetic complications: no    Last Vitals:  Vitals:   08/20/19 1830 08/20/19 2019  BP: (!) 149/78 136/75  Pulse: (!) 116 (!) 108  Resp: 18 16  Temp: (!) 38.4 C 37.2 C  SpO2: 98% 99%    Last Pain:  Vitals:   08/20/19 2019  TempSrc: Oral  PainSc:                  Aly Seidenberg

## 2019-08-20 NOTE — Interval H&P Note (Signed)
History and Physical Interval Note:  08/20/2019 4:02 PM  Andres Graham  has presented today for surgery, with the diagnosis of SCROTAL ABCESS.  The various methods of treatment have been discussed with the patient and family. After consideration of risks, benefits and other options for treatment, the patient has consented to  Procedure(s): SCROTUM EXPLORATION (N/A) as a surgical intervention.  The patient's history has been reviewed, patient examined, no change in status, stable for surgery.  I have reviewed the patient's chart and labs.  Questions were answered to the patient's satisfaction.     Crist Fat

## 2019-08-20 NOTE — H&P (Addendum)
History and Physical    Andres Graham LHT:[id number] DOB: [birth year] DOA: 08/20/2019  PCP: Patient, No Pcp Per  Patient coming from: Home.  Chief Complaint: Scrotal pain.  HPI: Andres Graham is a 44 y.o. male with history of diabetes mellitus type 2 has been taken off his medication about 15 years ago after he lost weight had come to the ER on August 17, 2019 2 days ago with complaints of scrotal pain at that time patient was diagnosed with epididymitis and discharged home on Levaquin.  During that visit patient blood sugar was found to be elevated and was discharged on Metformin which patient states he has been taking.  After going home patient started noticing discharge from the perineal area which was puslike.  And the pain in the scrotum has become more worse with increasing swelling.  ED Course: In the ER on exam patient has scrotal swelling tender to touch in the base.  And perineum also is tender to touch.  CT pelvis done shows perineal abscess with edema extending to the scrotum.  On-call urologist Dr. Marlou Porch was consulted and requested transfer to [facility] long for surgery.  Patient is started on vancomycin ceftriaxone and clindamycin.  Patient lab work show blood sugar of 345 anion gap of 14 WBC count of 24.4.  Blood pressure is elevated.  Review of Systems: As per HPI, rest all negative.   Past Medical History:  Diagnosis Date  . Diabetes mellitus without complication (HCC)     History reviewed. No pertinent surgical history.   reports that he has never smoked. He has never used smokeless tobacco. He reports that he does not drink alcohol or use drugs.  Allergies  Allergen Reactions  . Penicillins     unknown    Family History  Problem Relation Age of Onset  . Diabetes Mellitus II Father     Prior to Admission medications   Medication Sig Start Date End Date Taking? Authorizing Provider  levofloxacin (LEVAQUIN) 500 MG tablet Take 1 tablet (500 mg total) by mouth  daily. 08/18/19  Yes McDonald, Mia A, PA-C  metFORMIN (GLUCOPHAGE) 500 MG tablet Take 1 tablet (500 mg total) by mouth at bedtime. 08/18/19  Yes McDonald, Mia A, PA-C    Physical Exam: Constitutional: Moderately built and nourished. Vitals:   08/20/19 0135 08/20/19 0330 08/20/19 0500 08/20/19 0545  BP: (!) 163/92 (!) 160/93 (!) 149/88 (!) 139/91  Pulse: (!) 118 (!) 110 (!) 111 (!) 109  Resp: 16 17 (!) 22 (!) 22  Temp: 98.9 F (37.2 C)     TempSrc: Oral     SpO2: 100% 100% 100% 100%  Weight:      Height:       Eyes: Nonicteric no pallor. ENMT: No discharge from the ears eyes nose or mouth. Neck: No masses.  No neck rigidity. Respiratory: No rhonchi or crepitations. Cardiovascular: S1-S2 heard tachycardic. Abdomen: Soft scrotal appears swollen and tender. Musculoskeletal: No edema. Skin: No rash. Neurologic: Alert awake oriented time place and person.  Moves all extremities. Psychiatric: Appears normal per normal affect.   Labs on Admission: I have personally reviewed following labs and imaging studies  CBC: Recent Labs  Lab 08/17/19 2005 08/20/19 0141  WBC 17.3* 24.4*  NEUTROABS 13.2* 19.3*  HGB 13.1 13.4  HCT 37.9* 38.5*  MCV 87.3 86.7  PLT 247 286   Basic Metabolic Panel: Recent Labs  Lab 08/17/19 2005 08/20/19 0141  NA 134* 131*  K 3.4* 3.3*  CL 100  94*  CO2 22 23  GLUCOSE 259* 345*  BUN 7 6  CREATININE 0.70 0.75  CALCIUM 8.8* 8.7*   GFR: Estimated Creatinine Clearance: 160.3 mL/min (by C-G formula based on SCr of 0.75 mg/dL). Liver Function Tests: Recent Labs  Lab 08/17/19 2005 08/20/19 0141  AST 11* 14*  ALT 11 14  ALKPHOS 57 95  BILITOT 1.8* 1.4*  PROT 6.9 7.4  ALBUMIN 3.4* 3.0*   No results for input(s): LIPASE, AMYLASE in the last 168 hours. No results for input(s): AMMONIA in the last 168 hours. Coagulation Profile: No results for input(s): INR, PROTIME in the last 168 hours. Cardiac Enzymes: No results for input(s): CKTOTAL, CKMB,  CKMBINDEX, TROPONINI in the last 168 hours. BNP (last 3 results) No results for input(s): PROBNP in the last 8760 hours. HbA1C: No results for input(s): HGBA1C in the last 72 hours. CBG: No results for input(s): GLUCAP in the last 168 hours. Lipid Profile: No results for input(s): CHOL, HDL, LDLCALC, TRIG, CHOLHDL, LDLDIRECT in the last 72 hours. Thyroid Function Tests: No results for input(s): TSH, T4TOTAL, FREET4, T3FREE, THYROIDAB in the last 72 hours. Anemia Panel: No results for input(s): VITAMINB12, FOLATE, FERRITIN, TIBC, IRON, RETICCTPCT in the last 72 hours. Urine analysis:    Component Value Date/Time   COLORURINE YELLOW 08/20/2019 0140   APPEARANCEUR CLEAR 08/20/2019 0140   LABSPEC 1.026 08/20/2019 0140   PHURINE 5.0 08/20/2019 0140   GLUCOSEU >=500 (A) 08/20/2019 0140   HGBUR SMALL (A) 08/20/2019 0140   BILIRUBINUR NEGATIVE 08/20/2019 0140   KETONESUR 80 (A) 08/20/2019 0140   PROTEINUR 100 (A) 08/20/2019 0140   NITRITE NEGATIVE 08/20/2019 0140   LEUKOCYTESUR NEGATIVE 08/20/2019 0140   Sepsis Labs: @LABRCNTIP (procalcitonin:4,lacticidven:4) ) Recent Results (from the past 240 hour(s))  Blood Culture (routine x 2)     Status: None (Preliminary result)   Collection Time: 08/18/19 12:38 AM   Specimen: BLOOD  Result Value Ref Range Status   Specimen Description BLOOD RIGHT ANTECUBITAL  Final   Special Requests   Final    BOTTLES DRAWN AEROBIC AND ANAEROBIC Blood Culture adequate volume   Culture NO GROWTH 1 DAY  Final   Report Status PENDING  Incomplete  Urine culture     Status: None   Collection Time: 08/18/19  1:27 AM   Specimen: In/Out Cath Urine  Result Value Ref Range Status   Specimen Description IN/OUT CATH URINE  Final   Special Requests NONE  Final   Culture   Final    NO GROWTH Performed at [facility], [place] [address], [place], [place] [address]    Report Status 08/18/2019 FINAL  Final     Radiological Exams on Admission: CT PELVIS W  CONTRAST  Result Date: 08/20/2019 CLINICAL DATA:  Peroneal abscess EXAM: CT PELVIS WITH CONTRAST TECHNIQUE: Multidetector CT imaging of the pelvis was performed using the standard protocol following the bolus administration of intravenous contrast. CONTRAST:  129mL OMNIPAQUE IOHEXOL 300 MG/ML  SOLN COMPARISON:  Scrotal ultrasound same day, CT March 22nd 2021 FINDINGS: Urinary Tract: The visualized distal ureters and bladder appear unremarkable. Bowel: No bowel wall thickening, distention or surrounding inflammation identified within the pelvis. Scattered colonic diverticular true. Vascular/Lymphatic: No enlarged pelvic lymph nodes identified. No significant vascular findings. Reproductive: The prostate is unremarkable. Other: Within the left anterior perineal soft tissues there is a loculated fluid collection which measures approximately 4.3 x 1.6 x 4.6 cm. The loculated fluid collection extends into the anterior left scrotal soft  tissues. There is diffuse bilateral scrotal wall edema and non loculated fluid with skin thickening. No subcutaneous emphysema seen. Musculoskeletal: No acute or significant osseous findings. IMPRESSION: Loculated abscess seen within the anterior left perineal soft tissues measuring 4.3 x 1.6 x 4.6 cm. There is extensive subcutaneous edema skin thickening and non loculated fluid extending into the bilateral scrotal sacs. No subcutaneous emphysema. Electronically Signed   By: Jonna Clark M.D.   On: 08/20/2019 05:31   US Scrotum  Result Date: 08/20/2019 CLINICAL DATA:  Scrotal swelling and pain EXAM: ULTRASOUND OF SCROTUM TECHNIQUE: Complete ultrasound examination of the testicles, epididymis, and other scrotal structures was performed. COMPARISON:  August 17, 2019 FINDINGS: Right testicle Measurements: 3.2 x 1.9 x 2.1 cm. There is scrotal wall subcutaneous edema and skin thickening. No mass or microlithiasis visualized. Left testicle Measurements: 3.4 x 1.8 x 2.0 cm. There is scrotal  wall subcutaneous edema and skin thickening. No mass or microlithiasis visualized. Right epididymis:  Normal in size and appearance. Left epididymis:  Normal in size and appearance. Hydrocele:  There is a trace left hydrocele present. Varicocele:  None visualized. IMPRESSION: Bilateral scrotal wall edema and skin thickening. No definite scrotal sac fluid collection seen. Trace left hydrocele present. Partially visualized perineal soft tissue abscess. Electronically Signed   By: Jonna Clark M.D.   On: 08/20/2019 04:35     Assessment/Plan Principal Problem:   Sepsis (HCC) Active Problems:   Perineal abscess   Uncontrolled type 2 diabetes mellitus with hyperglycemia (HCC)    1. Sepsis secondary to perineal abscess with extension to the scrotum -Dr. Marlou Porch urology has been consulted patient is being transferred to [facility] long for surgery.  We will keep patient on vancomycin and ceftriaxone and clindamycin for now follow cultures.  IV hydration. 2. Diabetes mellitus type 2 uncontrolled presently on sliding scale coverage.  Closely follow metabolic panel and CBGs.  Will probably require long-acting insulin. 3. Elevated blood pressure reading will need close monitoring and keep patient as needed IV hydralazine.  Given the septic picture will need close monitoring for any further deterioration in inpatient status.  COVID test and EKG are pending.   DVT prophylaxis: SCDs for now since we will try to avoid anticoagulation anticipation of surgery. Code Status: Full code. Family Communication: Discussed with patient. Disposition Plan: To be determined. Consults called: Urologist. Admission status: Inpatient.   Eduard Clos MD [facility] Pager [phone].  If 7PM-7AM, please contact night-coverage [email or web address] Password [facility]  08/20/2019, 6:26 AM

## 2019-08-20 NOTE — Progress Notes (Signed)
Patient is back from surgery. He is in the yellow mews. His vital signs are:  BP 149/78 HR 116 RR 18 O2 98% on [place] 2 L Temp 101.1  PACU nurse Cala Bradford informed me that patient had IV tylenol in PACU and his fevers have been trending down. Patient has 2 pimrose drains in scrotum and it is covered w/ guaze.   Notified Charge RN [id number]  Notified Dr. Pola Corn via secure chat.   Patient is A&O x4 and is stable.

## 2019-08-20 NOTE — Progress Notes (Signed)
Floor RN called into PACU and concerned pt has fever and high heart rate. Advieed RN pt received IV tylenol.   This RN advised her pt met criteia for discharge from PACU. Advised RN to call attending,call anesthesia or call Rapid response.  Pt has already left PACU in NAD.  HR rate and respiratory and febrile as per preop vitals.

## 2019-08-20 NOTE — H&P (View-Only) (Signed)
I have been asked to see the patient by Dr. Midge Minium, for evaluation and management of scrotal abscess.  History of present illness: 44 year old diabetic male who presented to the emergency department late last night with worsening scrotal pain and swelling.  He was seen 3 days prior and diagnosed with epididymitis.  He was discharged home on Levaquin.  He presented to the emergency department with worsening swelling, drainage from the scrotal area and fever.  He was having significant pain. In the emergency department an ultrasound was performed which demonstrated no testicular involvement but apparent loculated abscess in the left perineal/scrotal region.  A CT scan was also performed demonstrating no evidence of Fournier's gangrene.  The patient was given ceftriaxone in the emergency department and transferred to [facility] long for further evaluation and management.   Review of systems: A 12 point comprehensive review of systems was obtained and is negative unless otherwise stated in the history of present illness.  Patient Active Problem List   Diagnosis Date Noted  . Sepsis (HCC) 08/20/2019  . Perineal abscess 08/20/2019  . Uncontrolled type 2 diabetes mellitus with hyperglycemia (HCC) 08/20/2019    No current facility-administered medications on file prior to encounter.   Current Outpatient Medications on File Prior to Encounter  Medication Sig Dispense Refill  . levofloxacin (LEVAQUIN) 500 MG tablet Take 1 tablet (500 mg total) by mouth daily. 10 tablet 0  . metFORMIN (GLUCOPHAGE) 500 MG tablet Take 1 tablet (500 mg total) by mouth at bedtime. 30 tablet 0    Past Medical History:  Diagnosis Date  . Diabetes mellitus without complication (HCC)     History reviewed. No pertinent surgical history.  Social History   Tobacco Use  . Smoking status: Never Smoker  . Smokeless tobacco: Never Used  Substance Use Topics  . Alcohol use: No  . Drug use: No    Family History   Problem Relation Age of Onset  . Diabetes Mellitus II Father     PE: Vitals:   08/20/19 0545 08/20/19 0700 08/20/19 0727 08/20/19 0801  BP: (!) 139/91   (!) 160/105  Pulse: (!) 109 (!) 103  (!) 106  Resp: (!) 22 13  20   Temp:   98.4 F (36.9 C) 99 F (37.2 C)  TempSrc:   Oral Oral  SpO2: 100% 100%  100%  Weight:      Height:       Patient appears to be in no acute distress  patient is alert and oriented x3 Atraumatic normocephalic head No cervical or supraclavicular lymphadenopathy appreciated No increased work of breathing, no audible wheezes/rhonchi Regular sinus rhythm/rate Abdomen is soft, nontender, nondistended, no CVA or suprapubic tenderness The patient has a large swollen scrotum that is tender and fluctuant with some purulent discharge in the most dependent or posterior aspect. Lower extremities are symmetric without appreciable edema Grossly neurologically intact No identifiable skin lesions  Recent Labs    08/17/19 2005 08/20/19 0141  WBC 17.3* 24.4*  HGB 13.1 13.4  HCT 37.9* 38.5*   Recent Labs    08/17/19 2005 08/20/19 0141  NA 134* 131*  K 3.4* 3.3*  CL 100 94*  CO2 22 23  GLUCOSE 259* 345*  BUN 7 6  CREATININE 0.70 0.75  CALCIUM 8.8* 8.7*   No results for input(s): LABPT, INR in the last 72 hours. No results for input(s): LABURIN in the last 72 hours. Results for orders placed or performed during the hospital encounter of 08/20/19  Blood  Culture (routine x 2)     Status: None (Preliminary result)   Collection Time: 08/20/19  4:00 AM   Specimen: BLOOD LEFT WRIST  Result Value Ref Range Status   Specimen Description BLOOD LEFT WRIST  Final   Special Requests   Final    BOTTLES DRAWN AEROBIC AND ANAEROBIC Blood Culture adequate volume   Culture   Final    NO GROWTH < 12 HOURS Performed at [facility], [address], [place], [place] [address]    Report Status PENDING  Incomplete  Respiratory Panel by RT PCR (Flu A&B, Covid) -  Nasopharyngeal Swab     Status: None   Collection Time: 08/20/19  5:47 AM   Specimen: Nasopharyngeal Swab  Result Value Ref Range Status   SARS Coronavirus 2 by RT PCR NEGATIVE NEGATIVE Final    Comment: (NOTE) SARS-CoV-2 target nucleic acids are NOT DETECTED. The SARS-CoV-2 RNA is generally detectable in upper respiratoy specimens during the acute phase of infection. The lowest concentration of SARS-CoV-2 viral copies this assay can detect is 131 copies/mL. A negative result does not preclude SARS-Cov-2 infection and should not be used as the sole basis for treatment or other patient management decisions. A negative result may occur with  improper specimen collection/handling, submission of specimen other than nasopharyngeal swab, presence of viral mutation(s) within the areas targeted by this assay, and inadequate number of viral copies (<131 copies/mL). A negative result must be combined with clinical observations, patient history, and epidemiological information. The expected result is Negative. Fact Sheet for Patients:  PinkCheek.be Fact Sheet for Healthcare Providers:  GravelBags.it This test is not yet ap proved or cleared by the [place] FDA and  has been authorized for detection and/or diagnosis of SARS-CoV-2 by FDA under an Emergency Use Authorization (EUA). This EUA will remain  in effect (meaning this test can be used) for the duration of the COVID-19 declaration under Section 564(b)(1) of the Act, 21 U.S.C. section 360bbb-3(b)(1), unless the authorization is terminated or revoked sooner.    Influenza A by PCR NEGATIVE NEGATIVE Final   Influenza B by PCR NEGATIVE NEGATIVE Final    Comment: (NOTE) The Xpert Xpress SARS-CoV-2/FLU/RSV assay is intended as an aid in  the diagnosis of influenza from Nasopharyngeal swab specimens and  should not be used as a sole basis for treatment. Nasal washings and  aspirates  are unacceptable for Xpert Xpress SARS-CoV-2/FLU/RSV  testing. Fact Sheet for Patients: PinkCheek.be Fact Sheet for Healthcare Providers: GravelBags.it This test is not yet approved or cleared by the [place] FDA and  has been authorized for detection and/or diagnosis of SARS-CoV-2 by  FDA under an Emergency Use Authorization (EUA). This EUA will remain  in effect (meaning this test can be used) for the duration of the  Covid-19 declaration under Section 564(b)(1) of the Act, 21  U.S.C. section 360bbb-3(b)(1), unless the authorization is  terminated or revoked. Performed at [facility], [place] [address] [place],  [address]   Surgical PCR screen     Status: None   Collection Time: 08/20/19  8:19 AM   Specimen: Nasal Mucosa; Nasal Swab  Result Value Ref Range Status   MRSA, PCR NEGATIVE NEGATIVE Final   Staphylococcus aureus NEGATIVE NEGATIVE Final    Comment: (NOTE) The Xpert SA Assay (FDA approved for NASAL specimens in patients 27 years of age and older), is one component of a comprehensive surveillance program. It is not intended to diagnose infection nor  to guide or monitor treatment. Performed at [facility], [address] [place], [place] [address]     Imaging: Independently reviewed the patient's scrotal ultrasound as well as a CT scan the findings as noted above.  I discussed these findings with the patient.  Imp: The patient has a perineal/scrotal abscess that has failed conservative medical management.  Fortunately, the testicles do not seem to be involved at this point.  He is also diabetic and has had Poor glucose control recently.   Recommendations: The plan is to take the patient to the operating room urgently for drainage of the abscess.  Would continue with the broad-spectrum antibiotics and work towards tight glucose control.  I suspect he will feel significantly  better once we obtain source control of his infection.   Veola Cafaro W On[place] Davidovich  

## 2019-08-20 NOTE — Transfer of Care (Signed)
Immediate Anesthesia Transfer of Care Note  Patient: Andres Graham  Procedure(s) Performed: PERINEAL ABSCESS INCISION AND DRAINAGE/ SCROTUM EXPLORATION (N/A )  Patient Location: PACU  Anesthesia Type:General  Level of Consciousness: awake, alert , oriented and patient cooperative  Airway & Oxygen Therapy: Patient Spontanous Breathing and Patient connected to face mask oxygen  Post-op Assessment: Report given to RN and Post -op Vital signs reviewed and stable  Post vital signs: Reviewed and stable  Last Vitals:  Vitals Value Taken Time  BP 160/91 08/20/19 1730  Temp    Pulse 118 08/20/19 1734  Resp 25 08/20/19 1734  SpO2 100 % 08/20/19 1734  Vitals shown include unvalidated device data.  Last Pain:  Vitals:   08/20/19 1143  TempSrc:   PainSc: 7          Complications: No apparent anesthesia complications

## 2019-08-20 NOTE — ED Provider Notes (Signed)
Patient seen/examined in the Emergency Department in conjunction with Advanced Practice Provider McDonald Patient reports scrotal pain/swelling Exam : awake/alert, uncomfortable appearing, swelling/tenderness to scrotum, no crepitus (chaperone present) Plan: pt will need admission/IV antibiotics and likely surgical consult    Zadie Rhine, MD 08/20/19 [phone]

## 2019-08-20 NOTE — ED Notes (Signed)
Patient noted to be throwing up in lobby. Triage RN Reita Cliche notified.

## 2019-08-20 NOTE — Consult Note (Signed)
I have been asked to see the patient by Dr. Midge Minium, for evaluation and management of scrotal abscess.  History of present illness: 44 year old diabetic male who presented to the emergency department late last night with worsening scrotal pain and swelling.  He was seen 3 days prior and diagnosed with epididymitis.  He was discharged home on Levaquin.  He presented to the emergency department with worsening swelling, drainage from the scrotal area and fever.  He was having significant pain. In the emergency department an ultrasound was performed which demonstrated no testicular involvement but apparent loculated abscess in the left perineal/scrotal region.  A CT scan was also performed demonstrating no evidence of Fournier's gangrene.  The patient was given ceftriaxone in the emergency department and transferred to [facility] long for further evaluation and management.   Review of systems: A 12 point comprehensive review of systems was obtained and is negative unless otherwise stated in the history of present illness.  Patient Active Problem List   Diagnosis Date Noted  . Sepsis (HCC) 08/20/2019  . Perineal abscess 08/20/2019  . Uncontrolled type 2 diabetes mellitus with hyperglycemia (HCC) 08/20/2019    No current facility-administered medications on file prior to encounter.   Current Outpatient Medications on File Prior to Encounter  Medication Sig Dispense Refill  . levofloxacin (LEVAQUIN) 500 MG tablet Take 1 tablet (500 mg total) by mouth daily. 10 tablet 0  . metFORMIN (GLUCOPHAGE) 500 MG tablet Take 1 tablet (500 mg total) by mouth at bedtime. 30 tablet 0    Past Medical History:  Diagnosis Date  . Diabetes mellitus without complication (HCC)     History reviewed. No pertinent surgical history.  Social History   Tobacco Use  . Smoking status: Never Smoker  . Smokeless tobacco: Never Used  Substance Use Topics  . Alcohol use: No  . Drug use: No    Family History   Problem Relation Age of Onset  . Diabetes Mellitus II Father     PE: Vitals:   08/20/19 0545 08/20/19 0700 08/20/19 0727 08/20/19 0801  BP: (!) 139/91   (!) 160/105  Pulse: (!) 109 (!) 103  (!) 106  Resp: (!) 22 13  20   Temp:   98.4 F (36.9 C) 99 F (37.2 C)  TempSrc:   Oral Oral  SpO2: 100% 100%  100%  Weight:      Height:       Patient appears to be in no acute distress  patient is alert and oriented x3 Atraumatic normocephalic head No cervical or supraclavicular lymphadenopathy appreciated No increased work of breathing, no audible wheezes/rhonchi Regular sinus rhythm/rate Abdomen is soft, nontender, nondistended, no CVA or suprapubic tenderness The patient has a large swollen scrotum that is tender and fluctuant with some purulent discharge in the most dependent or posterior aspect. Lower extremities are symmetric without appreciable edema Grossly neurologically intact No identifiable skin lesions  Recent Labs    08/17/19 2005 08/20/19 0141  WBC 17.3* 24.4*  HGB 13.1 13.4  HCT 37.9* 38.5*   Recent Labs    08/17/19 2005 08/20/19 0141  NA 134* 131*  K 3.4* 3.3*  CL 100 94*  CO2 22 23  GLUCOSE 259* 345*  BUN 7 6  CREATININE 0.70 0.75  CALCIUM 8.8* 8.7*   No results for input(s): LABPT, INR in the last 72 hours. No results for input(s): LABURIN in the last 72 hours. Results for orders placed or performed during the hospital encounter of 08/20/19  Blood  Culture (routine x 2)     Status: None (Preliminary result)   Collection Time: 08/20/19  4:00 AM   Specimen: BLOOD LEFT WRIST  Result Value Ref Range Status   Specimen Description BLOOD LEFT WRIST  Final   Special Requests   Final    BOTTLES DRAWN AEROBIC AND ANAEROBIC Blood Culture adequate volume   Culture   Final    NO GROWTH < 12 HOURS Performed at [facility], [address], [place], [place] [address]    Report Status PENDING  Incomplete  Respiratory Panel by RT PCR (Flu A&B, Covid) -  Nasopharyngeal Swab     Status: None   Collection Time: 08/20/19  5:47 AM   Specimen: Nasopharyngeal Swab  Result Value Ref Range Status   SARS Coronavirus 2 by RT PCR NEGATIVE NEGATIVE Final    Comment: (NOTE) SARS-CoV-2 target nucleic acids are NOT DETECTED. The SARS-CoV-2 RNA is generally detectable in upper respiratoy specimens during the acute phase of infection. The lowest concentration of SARS-CoV-2 viral copies this assay can detect is 131 copies/mL. A negative result does not preclude SARS-Cov-2 infection and should not be used as the sole basis for treatment or other patient management decisions. A negative result may occur with  improper specimen collection/handling, submission of specimen other than nasopharyngeal swab, presence of viral mutation(s) within the areas targeted by this assay, and inadequate number of viral copies (<131 copies/mL). A negative result must be combined with clinical observations, patient history, and epidemiological information. The expected result is Negative. Fact Sheet for Patients:  PinkCheek.be Fact Sheet for Healthcare Providers:  GravelBags.it This test is not yet ap proved or cleared by the [place] FDA and  has been authorized for detection and/or diagnosis of SARS-CoV-2 by FDA under an Emergency Use Authorization (EUA). This EUA will remain  in effect (meaning this test can be used) for the duration of the COVID-19 declaration under Section 564(b)(1) of the Act, 21 U.S.C. section 360bbb-3(b)(1), unless the authorization is terminated or revoked sooner.    Influenza A by PCR NEGATIVE NEGATIVE Final   Influenza B by PCR NEGATIVE NEGATIVE Final    Comment: (NOTE) The Xpert Xpress SARS-CoV-2/FLU/RSV assay is intended as an aid in  the diagnosis of influenza from Nasopharyngeal swab specimens and  should not be used as a sole basis for treatment. Nasal washings and  aspirates  are unacceptable for Xpert Xpress SARS-CoV-2/FLU/RSV  testing. Fact Sheet for Patients: PinkCheek.be Fact Sheet for Healthcare Providers: GravelBags.it This test is not yet approved or cleared by the [place] FDA and  has been authorized for detection and/or diagnosis of SARS-CoV-2 by  FDA under an Emergency Use Authorization (EUA). This EUA will remain  in effect (meaning this test can be used) for the duration of the  Covid-19 declaration under Section 564(b)(1) of the Act, 21  U.S.C. section 360bbb-3(b)(1), unless the authorization is  terminated or revoked. Performed at [facility], [address] [place],  [address]   Surgical PCR screen     Status: None   Collection Time: 08/20/19  8:19 AM   Specimen: Nasal Mucosa; Nasal Swab  Result Value Ref Range Status   MRSA, PCR NEGATIVE NEGATIVE Final   Staphylococcus aureus NEGATIVE NEGATIVE Final    Comment: (NOTE) The Xpert SA Assay (FDA approved for NASAL specimens in patients 27 years of age and older), is one component of a comprehensive surveillance program. It is not intended to diagnose infection nor  to guide or monitor treatment. Performed at [facility], [address] [place], [place] [address]     Imaging: Independently reviewed the patient's scrotal ultrasound as well as a CT scan the findings as noted above.  I discussed these findings with the patient.  Imp: The patient has a perineal/scrotal abscess that has failed conservative medical management.  Fortunately, the testicles do not seem to be involved at this point.  He is also diabetic and has had Poor glucose control recently.   Recommendations: The plan is to take the patient to the operating room urgently for drainage of the abscess.  Would continue with the broad-spectrum antibiotics and work towards tight glucose control.  I suspect he will feel significantly  better once we obtain source control of his infection.   Crist Fat

## 2019-08-20 NOTE — Anesthesia Preprocedure Evaluation (Signed)
Anesthesia Evaluation  Patient identified by MRN, date of birth, ID band Patient awake    Reviewed: Allergy & Precautions, NPO status , Patient's Chart, lab work & pertinent test results  Airway Mallampati: II  TM Distance: >3 FB Neck ROM: Full    Dental no notable dental hx.    Pulmonary neg pulmonary ROS,    Pulmonary exam normal breath sounds clear to auscultation       Cardiovascular negative cardio ROS Normal cardiovascular exam Rhythm:Regular Rate:Normal     Neuro/Psych negative neurological ROS  negative psych ROS   GI/Hepatic negative GI ROS, Neg liver ROS,   Endo/Other  diabetes, Oral Hypoglycemic Agents  Renal/GU negative Renal ROS  negative genitourinary   Musculoskeletal negative musculoskeletal ROS (+)   Abdominal   Peds negative pediatric ROS (+)  Hematology negative hematology ROS (+)   Anesthesia Other Findings   Reproductive/Obstetrics negative OB ROS                             Anesthesia Physical Anesthesia Plan  ASA: III and emergent  Anesthesia Plan: General   Post-op Pain Management:    Induction: Intravenous  PONV Risk Score and Plan: 2 and Ondansetron and Treatment may vary due to age or medical condition  Airway Management Planned: Oral ETT and LMA  Additional Equipment:   Intra-op Plan:   Post-operative Plan: Extubation in OR  Informed Consent:   Plan Discussed with: CRNA, Anesthesiologist and Surgeon  Anesthesia Plan Comments: (  )        Anesthesia Quick Evaluation

## 2019-08-20 NOTE — ED Triage Notes (Signed)
Patient reports persistent pain at bilateral testicles with swelling and drainage , seen here 3 days ago for the same complaints , currently taking oral antibiotics with no improvement , denies fever or chills.

## 2019-08-20 NOTE — Anesthesia Procedure Notes (Signed)
Procedure Name: LMA Insertion Date/Time: 08/20/2019 4:30 PM Performed by: Garth Bigness, CRNA Pre-anesthesia Checklist: Patient identified, Emergency Drugs available, Suction available, Patient being monitored and Timeout performed Patient Re-evaluated:Patient Re-evaluated prior to induction Oxygen Delivery Method: Circle system utilized Preoxygenation: Pre-oxygenation with 100% oxygen Induction Type: IV induction Ventilation: Mask ventilation without difficulty LMA: LMA with gastric port inserted LMA Size: 5.0 Number of attempts: 1 Placement Confirmation: positive ETCO2 Tube secured with: Tape Dental Injury: Teeth and Oropharynx as per pre-operative assessment

## 2019-08-20 NOTE — Progress Notes (Signed)
Pharmacy Antibiotic Note  Andres Graham is a 44 y.o. male admitted on 08/20/2019 with testicular pain with swelling.  Pharmacy has been consulted for vancomycin dosing. He was on levaquin as outpatient with no improvement  Plan: Vancomycin 2gm IV x 1 then 1750 IV q12 hours F/u renal function, cultures and clinical course  Height: 5\' 11"  (180.3 cm) Weight: 275 lb 9.2 oz (125 kg) IBW/kg (Calculated) : 75.3  Temp (24hrs), Avg:98.9 F (37.2 C), Min:98.9 F (37.2 C), Max:98.9 F (37.2 C)  Recent Labs  Lab 08/17/19 2005 08/18/19 0015 08/20/19 0138 08/20/19 0141  WBC 17.3*  --   --  24.4*  CREATININE 0.70  --   --  0.75  LATICACIDVEN  --  1.5 1.5  --     Estimated Creatinine Clearance: 160.3 mL/min (by C-G formula based on SCr of 0.75 mg/dL).    Allergies  Allergen Reactions  . Penicillins     unknown     Thank you for allowing pharmacy to be a part of this patient's care.  08/22/19 [place] 08/20/2019 4:11 AM

## 2019-08-21 LAB — CBC WITH DIFFERENTIAL/PLATELET
Abs Immature Granulocytes: 0.45 10*3/uL — ABNORMAL HIGH (ref 0.00–0.07)
Basophils Absolute: 0.1 10*3/uL (ref 0.0–0.1)
Basophils Relative: 0 %
Eosinophils Absolute: 0 10*3/uL (ref 0.0–0.5)
Eosinophils Relative: 0 %
HCT: 30.6 % — ABNORMAL LOW (ref 39.0–52.0)
Hemoglobin: 10.3 g/dL — ABNORMAL LOW (ref 13.0–17.0)
Immature Granulocytes: 2 %
Lymphocytes Relative: 10 %
Lymphs Abs: 2.2 10*3/uL (ref 0.7–4.0)
MCH: 30.1 pg (ref 26.0–34.0)
MCHC: 33.7 g/dL (ref 30.0–36.0)
MCV: 89.5 fL (ref 80.0–100.0)
Monocytes Absolute: 2.4 10*3/uL — ABNORMAL HIGH (ref 0.1–1.0)
Monocytes Relative: 10 %
Neutro Abs: 17.8 10*3/uL — ABNORMAL HIGH (ref 1.7–7.7)
Neutrophils Relative %: 78 %
Platelets: 259 10*3/uL (ref 150–400)
RBC: 3.42 MIL/uL — ABNORMAL LOW (ref 4.22–5.81)
RDW: 11.6 % (ref 11.5–15.5)
WBC: 22.9 10*3/uL — ABNORMAL HIGH (ref 4.0–10.5)
nRBC: 0 % (ref 0.0–0.2)

## 2019-08-21 LAB — GLUCOSE, CAPILLARY
Glucose-Capillary: 177 mg/dL — ABNORMAL HIGH (ref 70–99)
Glucose-Capillary: 184 mg/dL — ABNORMAL HIGH (ref 70–99)
Glucose-Capillary: 193 mg/dL — ABNORMAL HIGH (ref 70–99)
Glucose-Capillary: 218 mg/dL — ABNORMAL HIGH (ref 70–99)
Glucose-Capillary: 224 mg/dL — ABNORMAL HIGH (ref 70–99)
Glucose-Capillary: 225 mg/dL — ABNORMAL HIGH (ref 70–99)
Glucose-Capillary: 233 mg/dL — ABNORMAL HIGH (ref 70–99)
Glucose-Capillary: 235 mg/dL — ABNORMAL HIGH (ref 70–99)

## 2019-08-21 LAB — BASIC METABOLIC PANEL
Anion gap: 8 (ref 5–15)
BUN: 7 mg/dL (ref 6–20)
CO2: 23 mmol/L (ref 22–32)
Calcium: 8 mg/dL — ABNORMAL LOW (ref 8.9–10.3)
Chloride: 105 mmol/L (ref 98–111)
Creatinine, Ser: 0.68 mg/dL (ref 0.61–1.24)
GFR calc Af Amer: >60 mL/min (ref >60)
GFR calc non Af Amer: >60 mL/min (ref >60)
Glucose, Bld: 210 mg/dL — ABNORMAL HIGH (ref 70–99)
Potassium: 3.7 mmol/L (ref 3.5–5.1)
Sodium: 136 mmol/L (ref 135–145)

## 2019-08-21 LAB — PHOSPHORUS: Phosphorus: 2 mg/dL — ABNORMAL LOW (ref 2.5–4.6)

## 2019-08-21 MED ORDER — INSULIN ASPART 100 UNIT/ML ~~LOC~~ SOLN
0.0000 [IU] | Freq: Every day | SUBCUTANEOUS | Status: DC
  Administered 2019-08-21: 2 [IU] via SUBCUTANEOUS

## 2019-08-21 MED ORDER — SODIUM PHOSPHATES 45 MMOLE/15ML IV SOLN
20.0000 mmol | Freq: Once | INTRAVENOUS | Status: AC
  Administered 2019-08-21: 20 mmol via INTRAVENOUS
  Filled 2019-08-21: qty 6.67

## 2019-08-21 MED ORDER — MAGNESIUM SULFATE 2 GM/50ML IV SOLN
2.0000 g | Freq: Once | INTRAVENOUS | Status: AC
  Administered 2019-08-21: 2 g via INTRAVENOUS
  Filled 2019-08-21: qty 50

## 2019-08-21 MED ORDER — SODIUM CHLORIDE 0.9 % IV SOLN: INTRAVENOUS | Status: DC

## 2019-08-21 MED ORDER — INSULIN ASPART 100 UNIT/ML ~~LOC~~ SOLN
0.0000 [IU] | Freq: Three times a day (TID) | SUBCUTANEOUS | Status: DC
  Administered 2019-08-21: 17:00:00 3 [IU] via SUBCUTANEOUS
  Administered 2019-08-22: 08:00:00 2 [IU] via SUBCUTANEOUS
  Administered 2019-08-22: 17:00:00 1 [IU] via SUBCUTANEOUS
  Administered 2019-08-22 – 2019-08-23 (×2): 2 [IU] via SUBCUTANEOUS

## 2019-08-21 NOTE — Progress Notes (Signed)
Scrotal packing removed at this time.  Patient pre medicated with dilaudid and tolerated procedure well.  Packing noted to have moderate amount of sanguinous drainage.  Penrose drains remain intact.

## 2019-08-21 NOTE — Discharge Instructions (Signed)
Take antibiotics as directed. Keep clean guaze under the drain tubes that are coming out of the wound.   Ok to shower, but do not take a bath or soak the wound. Contact Urology office with worsening fever, pain or questions/concerns.

## 2019-08-21 NOTE — Progress Notes (Signed)
Urology Inpatient Progress Report  Sepsis ([place]) [A41.9]  Procedure(s): PERINEAL ABSCESS INCISION AND DRAINAGE/ SCROTUM EXPLORATION  1 Day Post-Op   Intv/Subj: No acute events overnight. Patient is without complaint. Febrile in PACU, but afebrile overnight, and feeling much better.  Principal Problem:   Sepsis ([place]) Active Problems:   Perineal abscess   Uncontrolled type 2 diabetes mellitus with hyperglycemia ([place])  Current Facility-Administered Medications  Medication Dose Route Frequency Provider Last Rate Last Admin  . 0.9 %  sodium chloride infusion   Intravenous Continuous Dahal, Binaya, MD      . acetaminophen (TYLENOL) tablet 650 mg  650 mg Oral Q6H PRN Ardis Hughs, MD       Or  . acetaminophen (TYLENOL) suppository 650 mg  650 mg Rectal Q6H PRN Ardis Hughs, MD      . cefTRIAXone (ROCEPHIN) 2 g in sodium chloride 0.9 % 100 mL IVPB  2 g Intravenous Q24H Ardis Hughs, MD 200 mL/hr at 08/21/19 0744 2 g at 08/21/19 0744  . clindamycin (CLEOCIN) IVPB 600 mg  600 mg Intravenous Q8H Ardis Hughs, MD 100 mL/hr at 08/21/19 0654 600 mg at 08/21/19 0654  . hydrALAZINE (APRESOLINE) injection 10 mg  10 mg Intravenous Q4H PRN Ardis Hughs, MD      . HYDROmorphone (DILAUDID) injection 0.5-1 mg  0.5-1 mg Intravenous Q2H PRN Ardis Hughs, MD      . insulin aspart (novoLOG) injection 0-9 Units  0-9 Units Subcutaneous Q4H Ardis Hughs, MD   2 Units at 08/21/19 [phone]  . insulin glargine (LANTUS) injection 20 Units  20 Units Subcutaneous QHS Ardis Hughs, MD   20 Units at 08/21/19 0000  . magnesium sulfate IVPB 2 g 50 mL  2 g Intravenous Once Dahal, Binaya, MD      . morphine 2 MG/ML injection 1 mg  1 mg Intravenous Q4H PRN Ardis Hughs, MD   1 mg at 08/21/19 0744  . mupirocin ointment (BACTROBAN) 2 % 1 application  1 application Nasal BID Ardis Hughs, MD   1 application at 90/24/09 2147  . ondansetron (ZOFRAN) tablet 4 mg   4 mg Oral Q6H PRN Ardis Hughs, MD       Or  . ondansetron [facility]) injection 4 mg  4 mg Intravenous Q6H PRN Ardis Hughs, MD   4 mg at 08/20/19 1216  . sodium phosphate 20 mmol in dextrose 5 % 250 mL infusion  20 mmol Intravenous Once Dahal, Marlowe Aschoff, MD      . vancomycin (VANCOREADY) IVPB 1750 mg/350 mL  1,750 mg Intravenous Q12H Ardis Hughs, MD   Stopped at 08/21/19 [phone]     Objective: Vital: Vitals:   08/20/19 1830 08/20/19 2019 08/21/19 0007 08/21/19 0435  BP: (!) 149/78 136/75 (!) 159/87 135/72  Pulse: (!) 116 (!) 108 (!) 102 (!) 106  Resp: 18 16 16 18   Temp: (!) 101.1 F (38.4 C) 98.9 F (37.2 C) 98.5 F (36.9 C) 99.8 F (37.7 C)  TempSrc: Oral Oral Oral Oral  SpO2: 98% 99% 97% 96%  Weight:      Height:       I/Os: I/O last 3 completed shifts: In: 3725.3 [I.V.:2925.3; IV [id number]:419] Out: 39 [Urine:450; Blood:40]  Physical Exam:  General: Patient is in no apparent distress Lungs: Normal respiratory effort, chest expands symmetrically. GI: The abdomen is soft and nontender without mass. Perineal wound packed, penrose drains coming through the wound Urine straw colored.  Ext: lower extremities symmetric  Lab Results: Recent Labs    08/20/19 0141 08/21/19 0533  WBC 24.4* 22.9*  HGB 13.4 10.3*  HCT 38.5* 30.6*   Recent Labs    08/20/19 0141 08/21/19 0533  NA 131* 136  K 3.3* 3.7  CL 94* 105  CO2 23 23  GLUCOSE 345* 210*  BUN 6 7  CREATININE 0.75 0.68  CALCIUM 8.7* 8.0*   No results for input(s): LABPT, INR in the last 72 hours. No results for input(s): LABURIN in the last 72 hours. Results for orders placed or performed during the hospital encounter of 08/20/19  Blood Culture (routine x 2)     Status: None (Preliminary result)   Collection Time: 08/20/19  4:00 AM   Specimen: BLOOD LEFT WRIST  Result Value Ref Range Status   Specimen Description BLOOD LEFT WRIST  Final   Special Requests   Final    BOTTLES DRAWN AEROBIC AND  ANAEROBIC Blood Culture adequate volume   Culture   Final    NO GROWTH 1 DAY Performed at [facility], [address], [place], [place] [address]    Report Status PENDING  Incomplete  Urine culture     Status: None   Collection Time: 08/20/19  4:50 AM   Specimen: In/Out Cath Urine  Result Value Ref Range Status   Specimen Description IN/OUT CATH URINE  Final   Special Requests NONE  Final   Culture   Final    NO GROWTH Performed at [facility], [address], [place], [place] [address]    Report Status 08/20/2019 FINAL  Final  Respiratory Panel by RT PCR (Flu A&B, Covid) - Nasopharyngeal Swab     Status: None   Collection Time: 08/20/19  5:47 AM   Specimen: Nasopharyngeal Swab  Result Value Ref Range Status   SARS Coronavirus 2 by RT PCR NEGATIVE NEGATIVE Final    Comment: (NOTE) SARS-CoV-2 target nucleic acids are NOT DETECTED. The SARS-CoV-2 RNA is generally detectable in upper respiratoy specimens during the acute phase of infection. The lowest concentration of SARS-CoV-2 viral copies this assay can detect is 131 copies/mL. A negative result does not preclude SARS-Cov-2 infection and should not be used as the sole basis for treatment or other patient management decisions. A negative result may occur with  improper specimen collection/handling, submission of specimen other than nasopharyngeal swab, presence of viral mutation(s) within the areas targeted by this assay, and inadequate number of viral copies (<131 copies/mL). A negative result must be combined with clinical observations, patient history, and epidemiological information. The expected result is Negative. Fact Sheet for Patients:  https://www.moore.com/ Fact Sheet for Healthcare Providers:  https://www.young.biz/ This test is not yet ap proved or cleared by the [place] FDA and  has been authorized for detection and/or diagnosis of SARS-CoV-2 by FDA under  an Emergency Use Authorization (EUA). This EUA will remain  in effect (meaning this test can be used) for the duration of the COVID-19 declaration under Section 564(b)(1) of the Act, 21 U.S.C. section 360bbb-3(b)(1), unless the authorization is terminated or revoked sooner.    Influenza A by PCR NEGATIVE NEGATIVE Final   Influenza B by PCR NEGATIVE NEGATIVE Final    Comment: (NOTE) The Xpert Xpress SARS-CoV-2/FLU/RSV assay is intended as an aid in  the diagnosis of influenza from Nasopharyngeal swab specimens and  should not be used as a sole basis for treatment. Nasal washings and  aspirates are unacceptable for Xpert  Xpress SARS-CoV-2/FLU/RSV  testing. Fact Sheet for Patients: https://www.moore.com/ Fact Sheet for Healthcare Providers: https://www.young.biz/ This test is not yet approved or cleared by the [place] FDA and  has been authorized for detection and/or diagnosis of SARS-CoV-2 by  FDA under an Emergency Use Authorization (EUA). This EUA will remain  in effect (meaning this test can be used) for the duration of the  Covid-19 declaration under Section 564(b)(1) of the Act, 21  U.S.C. section 360bbb-3(b)(1), unless the authorization is  terminated or revoked. Performed at [facility], [address] [place], [place] [address]   Surgical PCR screen     Status: None   Collection Time: 08/20/19  8:19 AM   Specimen: Nasal Mucosa; Nasal Swab  Result Value Ref Range Status   MRSA, PCR NEGATIVE NEGATIVE Final   Staphylococcus aureus NEGATIVE NEGATIVE Final    Comment: (NOTE) The Xpert SA Assay (FDA approved for NASAL specimens in patients 36 years of age and older), is one component of a comprehensive surveillance program. It is not intended to diagnose infection nor to guide or monitor treatment. Performed at [facility], [address] [place], [place] [address]   Aerobic/Anaerobic Culture  (surgical/deep wound)     Status: None (Preliminary result)   Collection Time: 08/20/19  4:41 PM   Specimen: Abscess  Result Value Ref Range Status   Specimen Description   Final    ABSCESS SCROTUM Performed at [facility], [address] [place], [place] [address]    Special Requests   Final    NONE Performed at [facility], [address] [place], [place] [address]    Gram Stain   Final    ABUNDANT WBC PRESENT, PREDOMINANTLY PMN ABUNDANT GRAM POSITIVE COCCI ABUNDANT GRAM NEGATIVE RODS Performed at [facility], [address], [place], [place] [address]    Culture PENDING  Incomplete   Report Status PENDING  Incomplete    Studies/Results: CT PELVIS W CONTRAST  Result Date: 08/20/2019 CLINICAL DATA:  Peroneal abscess EXAM: CT PELVIS WITH CONTRAST TECHNIQUE: Multidetector CT imaging of the pelvis was performed using the standard protocol following the bolus administration of intravenous contrast. CONTRAST:  OMNIPAQUE IOHEXOL 300 MG/ML  SOLN COMPARISON:  Scrotal ultrasound same day, CT March 22nd 2021 FINDINGS: Urinary Tract: The visualized distal ureters and bladder appear unremarkable. Bowel: No bowel wall thickening, distention or surrounding inflammation identified within the pelvis. Scattered colonic diverticular true. Vascular/Lymphatic: No enlarged pelvic lymph nodes identified. No significant vascular findings. Reproductive: The prostate is unremarkable. Other: Within the left anterior perineal soft tissues there is a loculated fluid collection which measures approximately 4.3 x 1.6 x 4.6 cm. The loculated fluid collection extends into the anterior left scrotal soft tissues. There is diffuse bilateral scrotal wall edema and non loculated fluid with skin thickening. No subcutaneous emphysema seen. Musculoskeletal: No acute or significant osseous findings. IMPRESSION: Loculated abscess seen within the anterior left perineal soft tissues measuring  4.3 x 1.6 x 4.6 cm. There is extensive subcutaneous edema skin thickening and non loculated fluid extending into the bilateral scrotal sacs. No subcutaneous emphysema. Electronically Signed   By: Jonna Clark M.D.   On: 08/20/2019 05:31   US Scrotum  Result Date: 08/20/2019 CLINICAL DATA:  Scrotal swelling and pain EXAM: ULTRASOUND OF SCROTUM TECHNIQUE: Complete ultrasound examination of the testicles, epididymis, and other scrotal structures was performed. COMPARISON:  August 17, 2019 FINDINGS: Right testicle Measurements: 3.2 x 1.9  x 2.1 cm. There is scrotal wall subcutaneous edema and skin thickening. No mass or microlithiasis visualized. Left testicle Measurements: 3.4 x 1.8 x 2.0 cm. There is scrotal wall subcutaneous edema and skin thickening. No mass or microlithiasis visualized. Right epididymis:  Normal in size and appearance. Left epididymis:  Normal in size and appearance. Hydrocele:  There is a trace left hydrocele present. Varicocele:  None visualized. IMPRESSION: Bilateral scrotal wall edema and skin thickening. No definite scrotal sac fluid collection seen. Trace left hydrocele present. Partially visualized perineal soft tissue abscess. Electronically Signed   By: Jonna Clark M.D.   On: 08/20/2019 04:35    Assessment: Procedure(s): PERINEAL ABSCESS INCISION AND DRAINAGE/ SCROTUM EXPLORATION, 1 Day Post-Op  doing well.  Plan: Remove the betadine soaked packing today. Keep clean gauze under penrose drains. Will schedule f/u for drain removal in Clinic on Monday. Antibiotics x 10 days - cultures from OR pending.  Berniece Salines, MD Urology 08/21/2019, 8:23 AM

## 2019-08-21 NOTE — Progress Notes (Signed)
   Vital Signs MEWS/VS Documentation      08/21/2019 0435 08/21/2019 0700 08/21/2019 0800 08/21/2019 1158   MEWS Score:  1  1  1  3    MEWS Score Color:  Green  Green  Green  Yellow   Resp:  18  --  --  18   Pulse:  (!) 106  --  --  (!) 104   BP:  135/72  --  --  137/83   Temp:  99.8 F (37.7 C)  --  --  (!) 101.5 F (38.6 C)   O2 Device:  Room Air  --  --  --   Level of Consciousness:  --  --  Alert  --     Patient with increased temp.  Medicated with tylenol.  Will recheck temp accordingly    08/21/2019,12:01 PM

## 2019-08-21 NOTE — Progress Notes (Signed)
PROGRESS NOTE  Andres Graham  DOB: 1975/11/05  PCP: Patient, No Pcp Per [id number]  DOA: 08/20/2019 Admitted From: Home  LOS: 1 day   Chief Complaint  Patient presents with  . Testicular Pain with Swelling   Brief narrative: Patient is a 44 year old male with history of type 2 diabetes mellitus previously on medication,, not on 8 years now after weight loss.   3/21, patient presented to the ED with complaint of scrotal pain.  CT scan at that time showed only some scrotal wall edema, he was diagnosed with epididymitis and discharged home on Levaquin.  During that visit patient blood sugar was found to be elevated and was discharged on Metformin.  At home, patient started noticing puslike discharge from the perineal area.  The pain and swelling in the area continued to worsen and hence he returned to the ED on 3/23.  In the ED, patient was afebrile, tachycardic to 110s, hypertensive to 160s, breathing comfortably in room air. Blood work showed WBC count elevated to 24.4, lactic acid 1.5, sodium 139, potassium 3.3 glucose level 345, A1c 11.8. CT abdomen pelvis showed perineal abscess 4.3 x 4.6 cm in size.  No evidence of Fournier's gangrene.  He was given IV Rocephin, IV vancomycin and IV clindamycin. Urologist was consulted.  Patient was admitted to hospitalist service.   Subjective: Patient was seen and examined this morning.  Young African-American male.  Lying down in bed.   Had a cough last 24 hours with pain at the surgical site.  Pain controlled this morning.   Having intermittent episodes of fever but less frequent now.  Assessment/Plan: Perineal/scrotal abscess Sepsis - POA -CT scan imaging as above.  No evidence of testicular involvement or Fournier's gangrene. -Urology consult appreciated.  Underwent I&D on 3/24. -Continue IV pain medicine for pain control. -Met sepsis criteria on admission with tachycardia, leukocytosis, source of infection. -Continue broad-spectrum  antibiotics, IV fluid. -Follow-up culture report.  Diabetes mellitus type 2 -A1c 11.8 on 3/24 -Reportedly, patient was on medicine in the past and he did not require it after weight loss. -He has been started on Lantus 20 units nightly and sliding scale insulin for now. -Need further adjustment as well as oral pills initiated prior to discharge.  Elevated blood pressure -No documented history of hypertension, however, his blood pressure is currently running mostly 150s.  May be also related to pain.  Continue to monitor. -May need oral medicine started for elevated blood pressure.  Obesity - Body mass index is 38.43 kg/m. Patient has been advised to make an attempt to improve diet and exercise patterns to aid in weight loss.  Code Status:  Full code DVT prophylaxis:  SCDs for now. Antimicrobials:  IV vancomycin, IV clindamycin and IV Rocephin. Fluid: Normal saline at a reduced rate of 75 mL/h Diet: Currently n.p.o. for surgery. Diabetic diet after that. Mobility: Encourage ambulation Family Communication: Spoke with wife at bedside this morning Discharge plan:  Anticipated date and disposition: Continue inpatient management for next 2 to 3 days Barriers: Ongoing surgery today  Consultants:  General surgery  Antimicrobials: Anti-infectives (From admission, onward)   Start     Dose/Rate Route Frequency Ordered Stop   08/21/19 0600  cefTRIAXone (ROCEPHIN) 2 g in sodium chloride 0.9 % 100 mL IVPB     2 g 200 mL/hr over 30 Minutes Intravenous Every 24 hours 08/20/19 0625     08/20/19 1600  vancomycin (VANCOREADY) IVPB 1750 mg/350 mL     1,750 mg 175  mL/hr over 120 Minutes Intravenous Every 12 hours 08/20/19 0410     08/20/19 0630  clindamycin (CLEOCIN) IVPB 600 mg     600 mg 100 mL/hr over 30 Minutes Intravenous Every 8 hours 08/20/19 0620     08/20/19 0615  cefTRIAXone (ROCEPHIN) 2 g in sodium chloride 0.9 % 100 mL IVPB     2 g 200 mL/hr over 30 Minutes Intravenous  Once  08/20/19 0605 08/20/19 1639   08/20/19 0415  vancomycin (VANCOREADY) IVPB 2000 mg/400 mL     2,000 mg 200 mL/hr over 120 Minutes Intravenous  Once 08/20/19 0404 08/20/19 0754        Code Status: Full Code   Diet Order            Diet Carb Modified Fluid consistency: Thin; Room service appropriate? Yes  Diet effective now              Infusions:  . sodium chloride 75 mL/hr at 08/21/19 0825  . cefTRIAXone (ROCEPHIN)  IV 2 g (08/21/19 0744)  . clindamycin (CLEOCIN) IV 600 mg (08/21/19 0654)  . sodium phosphate  Dextrose 5% IVPB 20 mmol (08/21/19 0946)  . vancomycin Stopped (08/21/19 5498)    Scheduled Meds: . insulin aspart  0-5 Units Subcutaneous QHS  . insulin aspart  0-9 Units Subcutaneous TID WC  . insulin glargine  20 Units Subcutaneous QHS    PRN meds: acetaminophen **OR** acetaminophen, hydrALAZINE, HYDROmorphone (DILAUDID) injection, morphine injection, ondansetron **OR** ondansetron (ZOFRAN) IV   Objective: Vitals:   08/21/19 1158 08/21/19 1322  BP: 137/83 133/78  Pulse: (!) 104 (!) 105  Resp: 18 18  Temp: (!) 101.5 F (38.6 C) 99.7 F (37.6 C)  SpO2: 98% 98%    Intake/Output Summary (Last 24 hours) at 08/21/2019 1525 Last data filed at 08/21/2019 1200 Gross per 24 hour  Intake 4445.29 ml  Output 490 ml  Net 3955.29 ml   Filed Weights   08/20/19 0133  Weight: 125 kg   Weight change:  Body mass index is 38.43 kg/m.   Physical Exam: General exam: Appears calm and comfortable. partially sedated from pain medicine Skin: No rashes, lesions or ulcers. HEENT: Atraumatic, normocephalic, supple neck, no obvious bleeding Lungs: Clear to auscultation bilaterally CVS: Regular rate and rhythm, no murmur GI/Abd soft, nontender, nondistended, bowel sound present CNS: Alert, awake, oriented x3 Psychiatry: Mood appropriate Extremities: No pedal edema, no calf tenderness  Data Review: I have personally reviewed the laboratory data and studies  available.  Recent Labs  Lab 08/17/19 2005 08/20/19 0141 08/21/19 0533  WBC 17.3* 24.4* 22.9*  NEUTROABS 13.2* 19.3* 17.8*  HGB 13.1 13.4 10.3*  HCT 37.9* 38.5* 30.6*  MCV 87.3 86.7 89.5  PLT 247 286 259   Recent Labs  Lab 08/17/19 2005 08/20/19 0141 08/20/19 0912 08/21/19 0533  NA 134* 131*  --  136  K 3.4* 3.3*  --  3.7  CL 100 94*  --  105  CO2 22 23  --  23  GLUCOSE 259* 345*  --  210*  BUN 7 6  --  7  CREATININE 0.70 0.75  --  0.68  CALCIUM 8.8* 8.7*  --  8.0*  MG  --   --  1.6*  --   PHOS  --   --   --  2.0*    Signed, Terrilee Croak, MD [facility] Pager: [phone] (Secure Chat preferred) 08/21/2019

## 2019-08-22 DIAGNOSIS — E118 Type 2 diabetes mellitus with unspecified complications: Secondary | ICD-10-CM

## 2019-08-22 DIAGNOSIS — N492 Inflammatory disorders of scrotum: Secondary | ICD-10-CM

## 2019-08-22 DIAGNOSIS — R110 Nausea: Secondary | ICD-10-CM

## 2019-08-22 DIAGNOSIS — I10 Essential (primary) hypertension: Secondary | ICD-10-CM

## 2019-08-22 DIAGNOSIS — L02215 Cutaneous abscess of perineum: Secondary | ICD-10-CM

## 2019-08-22 DIAGNOSIS — R509 Fever, unspecified: Secondary | ICD-10-CM

## 2019-08-22 LAB — CBC WITH DIFFERENTIAL/PLATELET
Abs Immature Granulocytes: 0.38 10*3/uL — ABNORMAL HIGH (ref 0.00–0.07)
Basophils Absolute: 0.1 10*3/uL (ref 0.0–0.1)
Basophils Relative: 0 %
Eosinophils Absolute: 0.1 10*3/uL (ref 0.0–0.5)
Eosinophils Relative: 1 %
HCT: 30.7 % — ABNORMAL LOW (ref 39.0–52.0)
Hemoglobin: 10.7 g/dL — ABNORMAL LOW (ref 13.0–17.0)
Immature Granulocytes: 2 %
Lymphocytes Relative: 16 %
Lymphs Abs: 3 10*3/uL (ref 0.7–4.0)
MCH: 30.9 pg (ref 26.0–34.0)
MCHC: 34.9 g/dL (ref 30.0–36.0)
MCV: 88.7 fL (ref 80.0–100.0)
Monocytes Absolute: 1.7 10*3/uL — ABNORMAL HIGH (ref 0.1–1.0)
Monocytes Relative: 9 %
Neutro Abs: 13.4 10*3/uL — ABNORMAL HIGH (ref 1.7–7.7)
Neutrophils Relative %: 72 %
Platelets: 282 10*3/uL (ref 150–400)
RBC: 3.46 MIL/uL — ABNORMAL LOW (ref 4.22–5.81)
RDW: 11.9 % (ref 11.5–15.5)
WBC: 18.7 10*3/uL — ABNORMAL HIGH (ref 4.0–10.5)
nRBC: 0 % (ref 0.0–0.2)

## 2019-08-22 LAB — GLUCOSE, CAPILLARY
Glucose-Capillary: 201 mg/dL — ABNORMAL HIGH (ref 70–99)
Glucose-Capillary: 192 mg/dL — ABNORMAL HIGH (ref 70–99)
Glucose-Capillary: 170 mg/dL — ABNORMAL HIGH (ref 70–99)
Glucose-Capillary: 142 mg/dL — ABNORMAL HIGH (ref 70–99)
Glucose-Capillary: 191 mg/dL — ABNORMAL HIGH (ref 70–99)

## 2019-08-22 LAB — MAGNESIUM: Magnesium: 1.5 mg/dL — ABNORMAL LOW (ref 1.7–2.4)

## 2019-08-22 LAB — BASIC METABOLIC PANEL
Anion gap: 10 (ref 5–15)
BUN: 6 mg/dL (ref 6–20)
CO2: 23 mmol/L (ref 22–32)
Calcium: 8.2 mg/dL — ABNORMAL LOW (ref 8.9–10.3)
Chloride: 102 mmol/L (ref 98–111)
Creatinine, Ser: 0.63 mg/dL (ref 0.61–1.24)
GFR calc Af Amer: >60 mL/min (ref >60)
GFR calc non Af Amer: >60 mL/min (ref >60)
Glucose, Bld: 181 mg/dL — ABNORMAL HIGH (ref 70–99)
Potassium: 3 mmol/L — ABNORMAL LOW (ref 3.5–5.1)
Sodium: 135 mmol/L (ref 135–145)

## 2019-08-22 LAB — LACTIC ACID, PLASMA: Lactic Acid, Venous: 1 mmol/L (ref 0.5–1.9)

## 2019-08-22 MED ORDER — MAGNESIUM SULFATE 2 GM/50ML IV SOLN
2.0000 g | Freq: Once | INTRAVENOUS | Status: AC
  Administered 2019-08-22: 16:00:00 2 g via INTRAVENOUS
  Filled 2019-08-22: qty 50

## 2019-08-22 MED ORDER — METFORMIN HCL 500 MG PO TABS
1000.0000 mg | ORAL_TABLET | Freq: Two times a day (BID) | ORAL | Status: DC
  Administered 2019-08-22 – 2019-08-23 (×3): 1000 mg via ORAL
  Filled 2019-08-22 (×3): qty 2

## 2019-08-22 MED ORDER — POTASSIUM CHLORIDE CRYS ER 20 MEQ PO TBCR
40.0000 meq | EXTENDED_RELEASE_TABLET | ORAL | Status: AC
  Administered 2019-08-22 (×2): 40 meq via ORAL
  Filled 2019-08-22 (×2): qty 2

## 2019-08-22 MED ORDER — AMOXICILLIN-POT CLAVULANATE 875-125 MG PO TABS
1.0000 | ORAL_TABLET | Freq: Two times a day (BID) | ORAL | Status: DC
  Administered 2019-08-22 – 2019-08-23 (×2): 1 via ORAL
  Filled 2019-08-22 (×2): qty 1

## 2019-08-22 MED ORDER — OXYCODONE-ACETAMINOPHEN 5-325 MG PO TABS
1.0000 | ORAL_TABLET | ORAL | Status: DC | PRN
  Administered 2019-08-22 – 2019-08-23 (×2): 1 via ORAL
  Filled 2019-08-22 (×2): qty 1

## 2019-08-22 MED ORDER — GLIPIZIDE 5 MG PO TABS
2.5000 mg | ORAL_TABLET | Freq: Every day | ORAL | Status: DC
  Administered 2019-08-23: 09:00:00 2.5 mg via ORAL
  Filled 2019-08-22: qty 1

## 2019-08-22 NOTE — Progress Notes (Signed)
PROGRESS NOTE  Andres Graham  DOB: 1976-05-07  PCP: Patient, No Pcp Per [id number]  DOA: 08/20/2019 Admitted From: Home  LOS: 2 days   Chief Complaint  Patient presents with  . Testicular Pain with Swelling   Brief narrative: Patient is a 44 year old male with history of type 2 diabetes mellitus previously on medication, not on it for years now after weight loss.   3/21, patient presented to the ED with complaint of scrotal pain.  CT scan at that time showed only some scrotal wall edema, he was diagnosed with epididymitis and discharged home on Levaquin.  During that visit patient blood sugar was found to be elevated and was discharged on Metformin.  At home, patient started noticing puslike discharge from the perineal area.  The pain and swelling in the area continued to worsen and hence he returned to the ED on 3/23.  In the ED, patient was afebrile, tachycardic to 110s, hypertensive to 160s, breathing comfortably in room air. Blood work showed WBC count elevated to 24.4, lactic acid 1.5, sodium 139, potassium 3.3 glucose level 345, A1c 11.8. CT abdomen pelvis showed perineal abscess 4.3 x 4.6 cm in size.  No evidence of Fournier's gangrene.  He was given IV Rocephin, IV vancomycin and IV clindamycin. Urologist was consulted.  Patient was admitted to hospitalist service.   Subjective: Patient was seen and examined this morning.  Young African-American male.  Lying down in bed.   He has episodes of pain requiring IV morphine.  Overall improving. T-max 101.5 yesterday. Blood sugar better this morning less than 200.  Assessment/Plan: Perineal/scrotal abscess Sepsis - POA -CT scan imaging as above.  No evidence of testicular involvement or Fournier's gangrene. -Urology consult appreciated.  Underwent I&D on 3/24. -Intraoperative culture is growing gram-positive cocci as well as gram-negative rods. -Blood culture did not show any growth. -Currently patient is on IV vancomycin, IV  clindamycin IV Rocephin. -We will get ID consultation for antibiotic choices at discharge. -Continue IV morphine for pain control.  I will also order for oral pain medicines as first preference for as needed pain control.   -T-max one 101.5 yesterday.  Uncontrolled diabetes mellitus type 2 -A1c 11.8 on 3/24 -Reportedly, patient was on medicine for diabetes in the past but he reports he did not require them after weight loss.   -He has been started on Lantus 20 units nightly and sliding scale insulin for now. -Today, I started him on glipizide and Metformin as well. -Needs further adjustment prior to discharge.  Elevated blood pressure -No documented history of hypertension, however, his blood pressure is currently running mostly 150s.  May be also related to pain.  Continue to monitor. -May need oral medicine started for elevated blood pressure.  Hypokalemia/hypomagnesemia -Blood work today showed potassium 3, magnesium 1.5.  Will give oral and IV replacement today and repeat blood work tomorrow.  Obesity - Body mass index is 38.43 kg/m. Patient has been advised to make an attempt to improve diet and exercise patterns to aid in weight loss.  Code Status:  Full code DVT prophylaxis:  SCDs for now. Antimicrobials:  IV vancomycin, IV clindamycin and IV Rocephin. Fluid: Normal saline at a reduced rate of 75 mL/h Diet: Currently n.p.o. for surgery. Diabetic diet after that. Mobility: Encourage ambulation Family Communication: Spoke with wife at bedside this morning Discharge plan:  Anticipated date and disposition: Continue inpatient management for now Barriers: Pending culture report awaited.  ID recommendation awaited  Consultants:  General surgery  Infectious disease  Antimicrobials: Anti-infectives (From admission, onward)   Start     Dose/Rate Route Frequency Ordered Stop   08/21/19 0600  cefTRIAXone (ROCEPHIN) 2 g in sodium chloride 0.9 % 100 mL IVPB     2 g 200 mL/hr over  30 Minutes Intravenous Every 24 hours 08/20/19 0625     08/20/19 1600  vancomycin (VANCOREADY) IVPB 1750 mg/350 mL     1,750 mg 175 mL/hr over 120 Minutes Intravenous Every 12 hours 08/20/19 0410     08/20/19 0630  clindamycin (CLEOCIN) IVPB 600 mg     600 mg 100 mL/hr over 30 Minutes Intravenous Every 8 hours 08/20/19 0620     08/20/19 0615  cefTRIAXone (ROCEPHIN) 2 g in sodium chloride 0.9 % 100 mL IVPB     2 g 200 mL/hr over 30 Minutes Intravenous  Once 08/20/19 0605 08/20/19 1639   08/20/19 0415  vancomycin (VANCOREADY) IVPB 2000 mg/400 mL     2,000 mg 200 mL/hr over 120 Minutes Intravenous  Once 08/20/19 0404 08/20/19 0754        Code Status: Full Code   Diet Order            Diet Carb Modified Fluid consistency: Thin; Room service appropriate? Yes  Diet effective now              Infusions:  . cefTRIAXone (ROCEPHIN)  IV 2 g (08/22/19 0816)  . clindamycin (CLEOCIN) IV 600 mg (08/22/19 0601)  . magnesium sulfate bolus IVPB    . vancomycin 1,750 mg (08/22/19 0355)    Scheduled Meds: . glipiZIDE  2.5 mg Oral QAC breakfast  . insulin aspart  0-5 Units Subcutaneous QHS  . insulin aspart  0-9 Units Subcutaneous TID WC  . insulin glargine  20 Units Subcutaneous QHS  . metFORMIN  1,000 mg Oral BID WC  . potassium chloride  40 mEq Oral Q2H    PRN meds: acetaminophen **OR** acetaminophen, hydrALAZINE, HYDROmorphone (DILAUDID) injection, morphine injection, ondansetron **OR** ondansetron (ZOFRAN) IV   Objective: Vitals:   08/22/19 0415 08/22/19 1349  BP: (!) 145/85 (!) 165/84  Pulse: 93 91  Resp: 16 18  Temp: 98.7 F (37.1 C) 99 F (37.2 C)  SpO2: 95% 96%    Intake/Output Summary (Last 24 hours) at 08/22/2019 1354 Last data filed at 08/22/2019 0830 Gross per 24 hour  Intake 240 ml  Output 1600 ml  Net -1360 ml   Filed Weights   08/20/19 0133  Weight: 125 kg   Weight change:  Body mass index is 38.43 kg/m.   Physical Exam: General exam: Appears calm  and comfortable. partially sedated from pain medicine Skin: No rashes, lesions or ulcers. HEENT: Atraumatic, normocephalic, supple neck, no obvious bleeding Lungs: Clear to auscultation bilaterally CVS: Regular rate and rhythm, no murmur GI/Abd soft, nontender, nondistended, bowel sound present CNS: Alert, awake, oriented x3 Psychiatry: Mood appropriate Extremities: No pedal edema, no calf tenderness  Data Review: I have personally reviewed the laboratory data and studies available.  Recent Labs  Lab 08/17/19 2005 08/20/19 0141 08/21/19 0533 08/22/19 1238  WBC 17.3* 24.4* 22.9* 18.7*  NEUTROABS 13.2* 19.3* 17.8* 13.4*  HGB 13.1 13.4 10.3* 10.7*  HCT 37.9* 38.5* 30.6* 30.7*  MCV 87.3 86.7 89.5 88.7  PLT 247 286 259 282   Recent Labs  Lab 08/17/19 2005 08/20/19 0141 08/20/19 0912 08/21/19 0533 08/22/19 1238  NA 134* 131*  --  136 135  K 3.4* 3.3*  --  3.7 3.0*  CL 100 94*  --  105 102  CO2 22 23  --  23 23  GLUCOSE 259* 345*  --  210* 181*  BUN 7 6  --  7 6  CREATININE 0.70 0.75  --  0.68 0.63  CALCIUM 8.8* 8.7*  --  8.0* 8.2*  MG  --   --  1.6*  --  1.5*  PHOS  --   --   --  2.0*  --     Signed, Lorin Glass, MD [facility] Pager: [phone] (Secure Chat preferred) 08/22/2019

## 2019-08-22 NOTE — Consult Note (Signed)
Date of Admission:  08/20/2019          Reason for Consult: Scrotal abscess    Referring Provider: Dr. Pola Cornahal   Assessment:  1. Perineal/scrotal abscess due to polymicrobial infection sp I and D by Urology in 2. Poorly controlled diabetic 3. Remote hx of unknown PCN allergy but tolerated amoxicillin with dental problem in 2011 4. Hypertension  Plan:  1. Change to Augmentin twice daily ensure that he leaves the hospital with ability to obtain 14 days worth of antibiotics 2. Follow-up cultures which are only growing anaerobes per microbiology and a skin contaminant  3. Follow-up with urology 4. I have emphasized to him the utmost importance of controlling his diabetes to avoid severe infection such as this 1 which can be at times life-threatening  Principal Problem:   Sepsis (HCC) Active Problems:   Perineal abscess   Uncontrolled type 2 diabetes mellitus with hyperglycemia (HCC)   Scheduled Meds: . amoxicillin-clavulanate  1 tablet Oral Q12H  . glipiZIDE  2.5 mg Oral QAC breakfast  . insulin aspart  0-5 Units Subcutaneous QHS  . insulin aspart  0-9 Units Subcutaneous TID WC  . insulin glargine  20 Units Subcutaneous QHS  . metFORMIN  1,000 mg Oral BID WC   Continuous Infusions: . vancomycin 1,750 mg (08/22/19 1716)   PRN Meds:.acetaminophen **OR** acetaminophen, hydrALAZINE, morphine injection, ondansetron **OR** ondansetron (ZOFRAN) IV, oxyCODONE-acetaminophen  HPI: Andres Graham is a 44 y.o. male with poorly controlled diabetes mellitus who had developed acute onset of scrotal pain last Saturday.  He was seen in the ER and thought to have epididymitis and discharged home on oral levofloxacin he had back to ER worsening swelling and drainage from the scrotal area of fevers and significant pain.  Ultrasound showed no testicular involvement but a loculated abscess in the left perineal scrotal area.  CT scan also was done which did not show evidence of frank Fournier's  gangrene.  He was given ceftriaxone in the ER and transferred to [facility] where he has been evaluated by urology.  He has received vancomycin and clindamycin he was taken to the operating room and had I&D of perineal abscess.  Cultures from the abscess are yielding largely anaerobes that are still being intubated along with a skin contaminant corynebacterium.  His MRSA from his nares was negative and he has no history of MRSA furuncles.  He appears to have a polymicrobial abscess.  He has a history of penicillin allergies but says this is just something has been on his chart he thinks his mother had told physicians when he was younger.  Upon further questioning though he did state that he had taken amoxicillin with bad tooth without any difficulties whatsoever.  I will therefore change him over to oral Augmentin and follow-up his cultures.  I would like to make sure he gets at least 2 weeks of Augmentin at discharge from the hospital.  He already has a follow-up appointment with urology on Monday to remove for repacking.  I will follow-up his cultures but otherwise follow him remotely while in the hospital.  Please call with further questions   Review of Systems: Review of Systems  Constitutional: Positive for chills and fever. Negative for malaise/fatigue and weight loss.  HENT: Negative for congestion and sore throat.   Eyes: Negative for blurred vision and photophobia.  Respiratory: Negative for cough, shortness of breath and wheezing.   Cardiovascular: Negative for chest pain, palpitations and leg swelling.  Gastrointestinal:  Positive for nausea. Negative for abdominal pain, blood in stool, constipation, diarrhea, heartburn, melena and vomiting.  Genitourinary: Negative for dysuria, flank pain and hematuria.  Musculoskeletal: Negative for back pain, falls, joint pain and myalgias.  Skin: Negative for itching and rash.  Neurological: Negative for dizziness, focal weakness, loss of  consciousness, weakness and headaches.  Endo/Heme/Allergies: Does not bruise/bleed easily.  Psychiatric/Behavioral: Negative for depression and suicidal ideas. The patient does not have insomnia.     Past Medical History:  Diagnosis Date  . Diabetes mellitus without complication (HCC)     Social History   Tobacco Use  . Smoking status: Never Smoker  . Smokeless tobacco: Never Used  Substance Use Topics  . Alcohol use: No  . Drug use: No    Family History  Problem Relation Age of Onset  . Diabetes Mellitus II Father    Allergies  Allergen Reactions  . Penicillins     Unknown States he can take amoxicillin     OBJECTIVE: Blood pressure (!) 165/84, pulse 91, temperature 99 F (37.2 C), temperature source Oral, resp. rate 18, height 5\' 11"  (1.803 m), weight 125 kg, SpO2 96 %.  Physical Exam Constitutional:      General: He is not in acute distress.    Appearance: Normal appearance. He is well-developed. He is not ill-appearing or diaphoretic.  HENT:     Head: Normocephalic and atraumatic.     Right Ear: Hearing and external ear normal.     Left Ear: Hearing and external ear normal.     Nose: No nasal deformity or rhinorrhea.  Eyes:     General: No scleral icterus.    Conjunctiva/sclera: Conjunctivae normal.     Right eye: Right conjunctiva is not injected.     Left eye: Left conjunctiva is not injected.     Pupils: Pupils are equal, round, and reactive to light.  Neck:     Vascular: No JVD.  Cardiovascular:     Rate and Rhythm: Normal rate and regular rhythm.     Heart sounds: S1 normal and S2 normal.  Pulmonary:     Effort: Pulmonary effort is normal. No respiratory distress.  Abdominal:     General: There is no distension.     Palpations: Abdomen is soft.  Musculoskeletal:        General: Normal range of motion.     Right shoulder: Normal.     Left shoulder: Normal.     Cervical back: Normal range of motion and neck supple.     Right hip: Normal.      Left hip: Normal.     Right knee: Normal.     Left knee: Normal.  Lymphadenopathy:     Head:     Right side of head: No submandibular, preauricular or posterior auricular adenopathy.     Left side of head: No submandibular, preauricular or posterior auricular adenopathy.     Cervical: No cervical adenopathy.     Right cervical: No superficial or deep cervical adenopathy.    Left cervical: No superficial or deep cervical adenopathy.  Skin:    General: Skin is warm and dry.     Coloration: Skin is not jaundiced or pale.     Findings: No abrasion, bruising, ecchymosis, erythema, lesion or rash.     Nails: There is no clubbing.  Neurological:     Mental Status: He is alert and oriented to person, place, and time.     Sensory: No sensory deficit.  Coordination: Coordination normal.     Gait: Gait normal.  Psychiatric:        Attention and Perception: He is attentive.        Mood and Affect: Mood normal.        Speech: Speech normal.        Behavior: Behavior normal. Behavior is cooperative.        Thought Content: Thought content normal.        Judgment: Judgment normal.    GU exam deferred  Lab Results Lab Results  Component Value Date   WBC 18.7 (H) 08/22/2019   HGB 10.7 (L) 08/22/2019   HCT 30.7 (L) 08/22/2019   MCV 88.7 08/22/2019   PLT 282 08/22/2019    Lab Results  Component Value Date   CREATININE 0.63 08/22/2019   BUN 6 08/22/2019   NA 135 08/22/2019   K 3.0 (L) 08/22/2019   CL 102 08/22/2019   CO2 23 08/22/2019    Lab Results  Component Value Date   ALT 14 08/20/2019   AST 14 (L) 08/20/2019   ALKPHOS 95 08/20/2019   BILITOT 1.4 (H) 08/20/2019     Microbiology: Recent Results (from the past 240 hour(s))  Blood Culture (routine x 2)     Status: None (Preliminary result)   Collection Time: 08/18/19 12:38 AM   Specimen: BLOOD  Result Value Ref Range Status   Specimen Description BLOOD RIGHT ANTECUBITAL  Final   Special Requests   Final    BOTTLES  DRAWN AEROBIC AND ANAEROBIC Blood Culture adequate volume   Culture   Final    NO GROWTH 4 DAYS Performed at [facility], [address] [place], [place] [address]    Report Status PENDING  Incomplete  Urine culture     Status: None   Collection Time: 08/18/19  1:27 AM   Specimen: In/Out Cath Urine  Result Value Ref Range Status   Specimen Description IN/OUT CATH URINE  Final   Special Requests NONE  Final   Culture   Final    NO GROWTH Performed at [facility], [place] [address] [place], [place] [address]    Report Status 08/18/2019 FINAL  Final  Blood Culture (routine x 2)     Status: None (Preliminary result)   Collection Time: 08/20/19  4:00 AM   Specimen: BLOOD LEFT FOREARM  Result Value Ref Range Status   Specimen Description BLOOD LEFT FOREARM  Final   Special Requests   Final    BOTTLES DRAWN AEROBIC AND ANAEROBIC Blood Culture adequate volume   Culture   Final    NO GROWTH 2 DAYS Performed at [facility], [place] [address], [place], [place] [address]    Report Status PENDING  Incomplete  Blood Culture (routine x 2)     Status: None (Preliminary result)   Collection Time: 08/20/19  4:00 AM   Specimen: BLOOD LEFT WRIST  Result Value Ref Range Status   Specimen Description BLOOD LEFT WRIST  Final   Special Requests   Final    BOTTLES DRAWN AEROBIC AND ANAEROBIC Blood Culture adequate volume   Culture   Final    NO GROWTH 2 DAYS Performed at [facility], [place] [address] [place], [place] [address]    Report Status PENDING  Incomplete  Urine culture     Status: None   Collection Time: 08/20/19  4:50 AM   Specimen: In/Out Cath Urine  Result Value Ref Range  Status   Specimen Description IN/OUT CATH URINE  Final   Special Requests NONE  Final   Culture   Final    NO GROWTH Performed at [facility], [address], [place], [place] [address]    Report Status 08/20/2019 FINAL  Final  Respiratory Panel by RT PCR (Flu A&B, Covid) -  Nasopharyngeal Swab     Status: None   Collection Time: 08/20/19  5:47 AM   Specimen: Nasopharyngeal Swab  Result Value Ref Range Status   SARS Coronavirus 2 by RT PCR NEGATIVE NEGATIVE Final    Comment: (NOTE) SARS-CoV-2 target nucleic acids are NOT DETECTED. The SARS-CoV-2 RNA is generally detectable in upper respiratoy specimens during the acute phase of infection. The lowest concentration of SARS-CoV-2 viral copies this assay can detect is 131 copies/mL. A negative result does not preclude SARS-Cov-2 infection and should not be used as the sole basis for treatment or other patient management decisions. A negative result may occur with  improper specimen collection/handling, submission of specimen other than nasopharyngeal swab, presence of viral mutation(s) within the areas targeted by this assay, and inadequate number of viral copies (<131 copies/mL). A negative result must be combined with clinical observations, patient history, and epidemiological information. The expected result is Negative. Fact Sheet for Patients:  https://www.moore.com/ Fact Sheet for Healthcare Providers:  https://www.young.biz/ This test is not yet ap proved or cleared by the [place] FDA and  has been authorized for detection and/or diagnosis of SARS-CoV-2 by FDA under an Emergency Use Authorization (EUA). This EUA will remain  in effect (meaning this test can be used) for the duration of the COVID-19 declaration under Section 564(b)(1) of the Act, 21 U.S.C. section 360bbb-3(b)(1), unless the authorization is terminated or revoked sooner.    Influenza A by PCR NEGATIVE NEGATIVE Final   Influenza B by PCR NEGATIVE NEGATIVE Final    Comment: (NOTE) The Xpert Xpress SARS-CoV-2/FLU/RSV assay is intended as an aid in  the diagnosis of influenza from Nasopharyngeal swab specimens and  should not be used as a sole basis for treatment. Nasal washings and  aspirates  are unacceptable for Xpert Xpress SARS-CoV-2/FLU/RSV  testing. Fact Sheet for Patients: https://www.moore.com/ Fact Sheet for Healthcare Providers: https://www.young.biz/ This test is not yet approved or cleared by the [place] FDA and  has been authorized for detection and/or diagnosis of SARS-CoV-2 by  FDA under an Emergency Use Authorization (EUA). This EUA will remain  in effect (meaning this test can be used) for the duration of the  Covid-19 declaration under Section 564(b)(1) of the Act, 21  U.S.C. section 360bbb-3(b)(1), unless the authorization is  terminated or revoked. Performed at [facility], [address], [place], [place] [address]   Surgical PCR screen     Status: None   Collection Time: 08/20/19  8:19 AM   Specimen: Nasal Mucosa; Nasal Swab  Result Value Ref Range Status   MRSA, PCR NEGATIVE NEGATIVE Final   Staphylococcus aureus NEGATIVE NEGATIVE Final    Comment: (NOTE) The Xpert SA Assay (FDA approved for NASAL specimens in patients 41 years of age and older), is one component of a comprehensive surveillance program. It is not intended to diagnose infection nor to guide or monitor treatment. Performed at [facility], [address], [place], [place] [address]   Aerobic/Anaerobic Culture (surgical/deep wound)     Status: None (Preliminary result)   Collection Time: 08/20/19  4:41 PM   Specimen: Abscess  Result Value Ref Range Status   Specimen Description   Final    ABSCESS SCROTUM Performed at [facility], [address], [place], [place] [address]    Special Requests   Final    NONE Performed at [facility], [address] [place], [place] [address]    Gram Stain   Final    ABUNDANT WBC PRESENT, PREDOMINANTLY PMN ABUNDANT GRAM POSITIVE COCCI ABUNDANT GRAM NEGATIVE RODS    Culture   Final    CULTURE REINCUBATED FOR BETTER GROWTH Performed at [facility], [address] [place], [place] [address]    Report Status PENDING  Incomplete    Acey Lav, MD [facility] [phone] pager  08/22/2019, 5:45 PM

## 2019-08-22 NOTE — Progress Notes (Signed)
Pharmacy - Antimicrobial Stewardship (Brief Note)  Patient with scrotal abscess.  Dr Daiva Eves with ID consulted on patient.  Antibiotics changed to amoxicillin//clavulonate (PCN allergy listed but he tolerates amoxicillin).  He does not have insurance.   Plan: - contacted [facility] team and asked if he qualified for [facility]  It appears he does with plans for [facility] team provide patient voucher and information tomorrow (3/27).  He will need a paper prescription - If [facility] declined, he stated he could likely afford with Good Rx coupon (would need app or printed for him)  (unfortunately,  [facility] is not able to provide meds to beds or pharmacy in hospital so that patient can have antibiotic in had at time of discharge)  Juliette Alcide, PharmD, BCPS.   Work Cell: [phone] 08/22/2019 3:32 PM

## 2019-08-23 LAB — CULTURE, BLOOD (ROUTINE X 2)
Culture: NO GROWTH
Special Requests: ADEQUATE

## 2019-08-23 LAB — CBC WITH DIFFERENTIAL/PLATELET
Abs Immature Granulocytes: 0.44 K/uL — ABNORMAL HIGH (ref 0.00–0.07)
Basophils Absolute: 0.1 K/uL (ref 0.0–0.1)
Basophils Relative: 1 %
Eosinophils Absolute: 0.2 K/uL (ref 0.0–0.5)
Eosinophils Relative: 1 %
HCT: 31.5 % — ABNORMAL LOW (ref 39.0–52.0)
Hemoglobin: 10.6 g/dL — ABNORMAL LOW (ref 13.0–17.0)
Immature Granulocytes: 3 %
Lymphocytes Relative: 20 %
Lymphs Abs: 2.7 K/uL (ref 0.7–4.0)
MCH: 29.9 pg (ref 26.0–34.0)
MCHC: 33.7 g/dL (ref 30.0–36.0)
MCV: 88.7 fL (ref 80.0–100.0)
Monocytes Absolute: 1.3 K/uL — ABNORMAL HIGH (ref 0.1–1.0)
Monocytes Relative: 9 %
Neutro Abs: 9.2 K/uL — ABNORMAL HIGH (ref 1.7–7.7)
Neutrophils Relative %: 66 %
Platelets: 317 K/uL (ref 150–400)
RBC: 3.55 MIL/uL — ABNORMAL LOW (ref 4.22–5.81)
RDW: 11.8 % (ref 11.5–15.5)
WBC: 13.9 K/uL — ABNORMAL HIGH (ref 4.0–10.5)
nRBC: 0 % (ref 0.0–0.2)

## 2019-08-23 LAB — BASIC METABOLIC PANEL
Anion gap: 9 (ref 5–15)
BUN: 5 mg/dL — ABNORMAL LOW (ref 6–20)
CO2: 22 mmol/L (ref 22–32)
Calcium: 8.4 mg/dL — ABNORMAL LOW (ref 8.9–10.3)
Chloride: 105 mmol/L (ref 98–111)
Creatinine, Ser: 0.56 mg/dL — ABNORMAL LOW (ref 0.61–1.24)
GFR calc Af Amer: >60 mL/min (ref >60)
GFR calc non Af Amer: >60 mL/min (ref >60)
Glucose, Bld: 234 mg/dL — ABNORMAL HIGH (ref 70–99)
Potassium: 3.5 mmol/L (ref 3.5–5.1)
Sodium: 136 mmol/L (ref 135–145)

## 2019-08-23 LAB — GLUCOSE, CAPILLARY
Glucose-Capillary: 197 mg/dL — ABNORMAL HIGH (ref 70–99)
Glucose-Capillary: 171 mg/dL — ABNORMAL HIGH (ref 70–99)

## 2019-08-23 LAB — MAGNESIUM: Magnesium: 1.6 mg/dL — ABNORMAL LOW (ref 1.7–2.4)

## 2019-08-23 MED ORDER — INSULIN NPH ISOPHANE & REGULAR (70-30) 100 UNIT/ML ~~LOC~~ SUSP: SUBCUTANEOUS | 0 refills | Status: AC

## 2019-08-23 MED ORDER — OXYCODONE-ACETAMINOPHEN 5-325 MG PO TABS: 1.0000 | ORAL_TABLET | Freq: Three times a day (TID) | ORAL | 0 refills | Status: AC | PRN

## 2019-08-23 MED ORDER — METFORMIN HCL 1000 MG PO TABS: 1000.0000 mg | ORAL_TABLET | Freq: Two times a day (BID) | ORAL | 0 refills | Status: AC

## 2019-08-23 MED ORDER — INSULIN GLARGINE 100 UNIT/ML ~~LOC~~ SOLN
25.0000 [IU] | Freq: Every day | SUBCUTANEOUS | Status: DC
  Filled 2019-08-23: qty 0.25

## 2019-08-23 MED ORDER — GLIPIZIDE 5 MG PO TABS: 2.5000 mg | ORAL_TABLET | Freq: Every day | ORAL | 0 refills | Status: AC

## 2019-08-23 MED ORDER — BLOOD GLUCOSE MONITOR KIT: PACK | 0 refills | Status: AC

## 2019-08-23 MED ORDER — AMOXICILLIN-POT CLAVULANATE 875-125 MG PO TABS: 1.0000 | ORAL_TABLET | Freq: Two times a day (BID) | ORAL | 0 refills | Status: AC

## 2019-08-23 MED ORDER — LISINOPRIL 10 MG PO TABS: 10.0000 mg | ORAL_TABLET | Freq: Every day | ORAL | 0 refills | Status: AC

## 2019-08-23 NOTE — Discharge Summary (Signed)
Physician Discharge Summary  Andres Graham WCB:[id number] DOB: [birth year] DOA: 08/20/2019  PCP: Patient, No Pcp Per  Admit date: 08/20/2019 Discharge date: 08/23/2019  Admitted From: Home Discharge disposition: Home   Code Status: Full Code  Diet Recommendation: Diabetic diet  Recommendations for Outpatient Follow-Up:   1. Follow-up with PCP as an outpatient 2. Follow-up with urologist on Monday 3/29   Discharge Diagnosis:   Principal Problem:   Sepsis (HCC) Active Problems:   Perineal abscess   Uncontrolled type 2 diabetes mellitus with hyperglycemia (HCC)    History of Present Illness / Brief narrative:  Patient is a 44 year old male with history of type 2 diabetes mellitus previously on medication, not on it for years now after weight loss.   3/21, patient presented to the ED with complaint of scrotal pain.  CT scan at that time showed only some scrotal wall edema, he was diagnosed with epididymitis and discharged home on Levaquin. During that visit patient blood sugar was found to be elevated and was discharged on Metformin.  At home, patient started noticing puslike discharge from the perineal area.  The pain and swelling in the area continued to worsen and hence he returned to the ED on 3/23.  In the ED, patient was afebrile, tachycardic to 110s, hypertensive to 160s, breathing comfortably in room air. Blood work showed WBC count elevated to 24.4, lactic acid 1.5, sodium 139, potassium 3.3 glucose level 345, A1c 11.8. CT abdomen pelvis showed perineal abscess 4.3 x 4.6 cm in size.  No evidence of Fournier's gangrene.  He was given IV Rocephin, IV vancomycin and IV clindamycin. Urologist was consulted.  Patient was admitted to hospitalist service.   Hospital Course:  Perineal/scrotal abscess Sepsis - POA -CT scan imaging as above.  No evidence of testicular involvement or Fournier's gangrene. -Urology consult appreciated.  Underwent I&D on 3/24. -Intraoperative  culture is growing gram-positive cocci as well as gram-negative rods. -Blood culture did not show any growth. -Initially patient was treated with broad-spectrum antibiotic including IV vancomycin, IV clindamycin IV Rocephin. -WBC count improving, close to normal now.  No fever in last 48 hours. -ID recommended oral Augmentin for 2 weeks post discharge. -Patient to follow-up with urologist on Monday 3/29. -Limited supply of pain medicine prescription given.  Uncontrolled diabetes mellitus type 2 -A1c 11.8 on 3/24 -Reportedly, patient was on medicine for diabetes in the past but he reports he did not require them after weight loss.   -In hospital, patient has been started on Lantus, glipizide and Metformin.   -Patient needs to continue insulin therapy for adequate control of diabetes.  He does not have insurance and is at risk of noncompliance.  I will discharge him on the affordable ([employer] brand of insulin) ReliOn which is a 70:30 combination at 10 units twice daily -We will also continue at discharge - glipizide 2.5 mg daily and Metformin 1000 mg twice daily.   -Patient will monitor blood sugar at home and follow-up with PCP for further adjustment.  Elevated blood pressure -No documented history of hypertension, however, his blood pressure is currently running mostly 150s.  -Probably is partly because of pain.  However it has been consistently elevated.  I will start him on lisinopril 10 mg daily because of coexisting diabetes mellitus. -Needs to follow-up with PCP as an outpatient for further adjustment.  Hypokalemia/hypomagnesemia -Improved electrolytes with replacement.  Obesity - Body mass index is 38.43 kg/m. Patient has been advised to make an attempt to improve diet and  exercise patterns to aid in weight loss.  Stable for discharge to home today.  Instructions and supplies given for wound dressing at home.  Subjective:  Seen and examined this morning.  Pleasant [place]  African-American male.  Pain controlled.  Wife at bedside.  Patient feels ready to go home today.  Discharge Exam:   Vitals:   08/22/19 0415 08/22/19 1349 08/22/19 2111 08/23/19 0552  BP: (!) 145/85 (!) 165/84 (!) 147/87 (!) 160/95  Pulse: 93 91 94 90  Resp: 16 18 20 19   Temp: 98.7 F (37.1 C) 99 F (37.2 C) 98.4 F (36.9 C) 98.6 F (37 C)  TempSrc: Oral Oral Oral Oral  SpO2: 95% 96% 98% 98%  Weight:      Height:        Body mass index is 38.43 kg/m.  General exam: Appears calm and comfortable.  Skin: No rashes, lesions or ulcers. HEENT: Atraumatic, normocephalic, supple neck, no obvious bleeding Lungs: Clear to auscultation bilaterally CVS: Regular rate and rhythm, no murmur GI/Abd soft, nontender, nondistended, bowel sound present Groin: surgical site has a drain in place CNS: Alert, awake, oriented x3 Psychiatry: Mood appropriate Extremities: No pedal edema, no calf tenderness  Discharge Instructions:  Wound care: Keep clean gauze under penrose drains. Discharge Instructions    Ambulatory referral to Internal Medicine   Complete by: As directed    Diet Carb Modified   Complete by: As directed    Increase activity slowly   Complete by: As directed      Follow-up Information    [facility] On 08/25/2019.   Why: For wound re-check, penrose drain removal. Contact information: [address] [place] [phone]       [facility] Follow up.   Contact information: [address] [place] [phone]         Allergies as of 08/23/2019   No Active Allergies     Medication List    STOP taking these medications   levofloxacin 500 MG tablet Commonly known as: LEVAQUIN     TAKE these medications   amoxicillin-clavulanate 875-125 MG tablet Commonly known as: AUGMENTIN Take 1 tablet by mouth every 12 (twelve) hours for 14 days.   glipiZIDE 5  MG tablet Commonly known as: GLUCOTROL Take 0.5 tablets (2.5 mg total) by mouth daily before breakfast. Start taking on: August 24, 2019   insulin NPH-regular Human (70-30) 100 UNIT/ML injection 10 units in am and 10 units in pm  Please provide 1 month supply   lisinopril 10 MG tablet Commonly known as: ZESTRIL Take 1 tablet (10 mg total) by mouth daily.   metFORMIN 1000 MG tablet Commonly known as: GLUCOPHAGE Take 1 tablet (1,000 mg total) by mouth 2 (two) times daily with a meal. What changed:   medication strength  how much to take  when to take this   oxyCODONE-acetaminophen 5-325 MG tablet Commonly known as: PERCOCET/ROXICET Take 1 tablet by mouth every 8 (eight) hours as needed for up to 5 days for moderate pain.       Time coordinating discharge: 35 minutes  The results of significant diagnostics from this hospitalization (including imaging, microbiology, ancillary and laboratory) are listed below for reference.    Procedures and Diagnostic Studies:   CT PELVIS W CONTRAST  Result Date: 08/20/2019 CLINICAL DATA:  Peroneal abscess EXAM: CT PELVIS WITH CONTRAST TECHNIQUE: Multidetector CT imaging of the pelvis was performed using  the standard protocol following the bolus administration of intravenous contrast. CONTRAST:  100mL OMNIPAQUE IOHEXOL 300 MG/ML  SOLN COMPARISON:  Scrotal ultrasound same day, CT March 22nd 2021 FINDINGS: Urinary Tract: The visualized distal ureters and bladder appear unremarkable. Bowel: No bowel wall thickening, distention or surrounding inflammation identified within the pelvis. Scattered colonic diverticular true. Vascular/Lymphatic: No enlarged pelvic lymph nodes identified. No significant vascular findings. Reproductive: The prostate is unremarkable. Other: Within the left anterior perineal soft tissues there is a loculated fluid collection which measures approximately 4.3 x 1.6 x 4.6 cm. The loculated fluid collection extends into the  anterior left scrotal soft tissues. There is diffuse bilateral scrotal wall edema and non loculated fluid with skin thickening. No subcutaneous emphysema seen. Musculoskeletal: No acute or significant osseous findings. IMPRESSION: Loculated abscess seen within the anterior left perineal soft tissues measuring 4.3 x 1.6 x 4.6 cm. There is extensive subcutaneous edema skin thickening and non loculated fluid extending into the bilateral scrotal sacs. No subcutaneous emphysema. Electronically Signed   By: Jonna ClarkBindu  Avutu M.D.   On: 08/20/2019 05:31   US Scrotum  Result Date: 08/20/2019 CLINICAL DATA:  Scrotal swelling and pain EXAM: ULTRASOUND OF SCROTUM TECHNIQUE: Complete ultrasound examination of the testicles, epididymis, and other scrotal structures was performed. COMPARISON:  August 17, 2019 FINDINGS: Right testicle Measurements: 3.2 x 1.9 x 2.1 cm. There is scrotal wall subcutaneous edema and skin thickening. No mass or microlithiasis visualized. Left testicle Measurements: 3.4 x 1.8 x 2.0 cm. There is scrotal wall subcutaneous edema and skin thickening. No mass or microlithiasis visualized. Right epididymis:  Normal in size and appearance. Left epididymis:  Normal in size and appearance. Hydrocele:  There is a trace left hydrocele present. Varicocele:  None visualized. IMPRESSION: Bilateral scrotal wall edema and skin thickening. No definite scrotal sac fluid collection seen. Trace left hydrocele present. Partially visualized perineal soft tissue abscess. Electronically Signed   By: Jonna ClarkBindu  Avutu M.D.   On: 08/20/2019 04:35     Labs:   Basic Metabolic Panel: Recent Labs  Lab 08/17/19 2005 08/17/19 2005 08/20/19 0141 08/20/19 0141 08/20/19 0912 08/21/19 0533 08/21/19 0533 08/22/19 1238 08/23/19 0856  NA 134*  --  131*  --   --  136  --  135 136  K 3.4*   < > 3.3*   < >  --  3.7   < > 3.0* 3.5  CL 100  --  94*  --   --  105  --  102 105  CO2 22  --  23  --   --  23  --  23 22  GLUCOSE 259*  --   345*  --   --  210*  --  181* 234*  BUN 7  --  6  --   --  7  --  6 5*  CREATININE 0.70  --  0.75  --   --  0.68  --  0.63 0.56*  CALCIUM 8.8*  --  8.7*  --   --  8.0*  --  8.2* 8.4*  MG  --   --   --   --  1.6*  --   --  1.5* 1.6*  PHOS  --   --   --   --   --  2.0*  --   --   --    < > = values in this interval not displayed.   GFR Estimated Creatinine Clearance: 160.3 mL/min (A) (by C-G formula  based on SCr of 0.56 mg/dL (L)). Liver Function Tests: Recent Labs  Lab 08/17/19 2005 08/20/19 0141  AST 11* 14*  ALT 11 14  ALKPHOS 57 95  BILITOT 1.8* 1.4*  PROT 6.9 7.4  ALBUMIN 3.4* 3.0*   No results for input(s): LIPASE, AMYLASE in the last 168 hours. No results for input(s): AMMONIA in the last 168 hours. Coagulation profile No results for input(s): INR, PROTIME in the last 168 hours.  CBC: Recent Labs  Lab 08/17/19 2005 08/20/19 0141 08/21/19 0533 08/22/19 1238 08/23/19 0856  WBC 17.3* 24.4* 22.9* 18.7* 13.9*  NEUTROABS 13.2* 19.3* 17.8* 13.4* 9.2*  HGB 13.1 13.4 10.3* 10.7* 10.6*  HCT 37.9* 38.5* 30.6* 30.7* 31.5*  MCV 87.3 86.7 89.5 88.7 88.7  PLT 247 286 259 282 317   Cardiac Enzymes: No results for input(s): CKTOTAL, CKMB, CKMBINDEX, TROPONINI in the last 168 hours. BNP: Invalid input(s): POCBNP CBG: Recent Labs  Lab 08/22/19 0745 08/22/19 1146 08/22/19 1701 08/22/19 2114 08/23/19 0749  GLUCAP 192* 170* 142* 191* 197*   D-Dimer No results for input(s): DDIMER in the last 72 hours. Hgb A1c No results for input(s): HGBA1C in the last 72 hours. Lipid Profile No results for input(s): CHOL, HDL, LDLCALC, TRIG, CHOLHDL, LDLDIRECT in the last 72 hours. Thyroid function studies No results for input(s): TSH, T4TOTAL, T3FREE, THYROIDAB in the last 72 hours.  Invalid input(s): FREET3 Anemia work up No results for input(s): VITAMINB12, FOLATE, FERRITIN, TIBC, IRON, RETICCTPCT in the last 72 hours. Microbiology Recent Results (from the past 240 hour(s))    Blood Culture (routine x 2)     Status: None (Preliminary result)   Collection Time: 08/18/19 12:38 AM   Specimen: BLOOD  Result Value Ref Range Status   Specimen Description BLOOD RIGHT ANTECUBITAL  Final   Special Requests   Final    BOTTLES DRAWN AEROBIC AND ANAEROBIC Blood Culture adequate volume   Culture   Final    NO GROWTH 4 DAYS Performed at [facility], [address], [place], [place] [address]    Report Status PENDING  Incomplete  Urine culture     Status: None   Collection Time: 08/18/19  1:27 AM   Specimen: In/Out Cath Urine  Result Value Ref Range Status   Specimen Description IN/OUT CATH URINE  Final   Special Requests NONE  Final   Culture   Final    NO GROWTH Performed at [facility], [address] [place], [place] [address]    Report Status 08/18/2019 FINAL  Final  Blood Culture (routine x 2)     Status: None (Preliminary result)   Collection Time: 08/20/19  4:00 AM   Specimen: BLOOD LEFT FOREARM  Result Value Ref Range Status   Specimen Description BLOOD LEFT FOREARM  Final   Special Requests   Final    BOTTLES DRAWN AEROBIC AND ANAEROBIC Blood Culture adequate volume   Culture   Final    NO GROWTH 2 DAYS Performed at [facility], [address], [place], [place] [address]    Report Status PENDING  Incomplete  Blood Culture (routine x 2)     Status: None (Preliminary result)   Collection Time: 08/20/19  4:00 AM   Specimen: BLOOD LEFT WRIST  Result Value Ref Range Status   Specimen Description BLOOD LEFT WRIST  Final   Special Requests   Final    BOTTLES DRAWN AEROBIC AND ANAEROBIC Blood Culture adequate volume  Culture   Final    NO GROWTH 2 DAYS Performed at [facility], [address], [place], [place] [address]    Report Status PENDING  Incomplete  Urine culture     Status: None   Collection Time: 08/20/19  4:50 AM   Specimen: In/Out Cath Urine  Result Value Ref Range Status   Specimen Description IN/OUT CATH  URINE  Final   Special Requests NONE  Final   Culture   Final    NO GROWTH Performed at [facility], [address], [place], [place] [address]    Report Status 08/20/2019 FINAL  Final  Respiratory Panel by RT PCR (Flu A&B, Covid) - Nasopharyngeal Swab     Status: None   Collection Time: 08/20/19  5:47 AM   Specimen: Nasopharyngeal Swab  Result Value Ref Range Status   SARS Coronavirus 2 by RT PCR NEGATIVE NEGATIVE Final    Comment: (NOTE) SARS-CoV-2 target nucleic acids are NOT DETECTED. The SARS-CoV-2 RNA is generally detectable in upper respiratoy specimens during the acute phase of infection. The lowest concentration of SARS-CoV-2 viral copies this assay can detect is 131 copies/mL. A negative result does not preclude SARS-Cov-2 infection and should not be used as the sole basis for treatment or other patient management decisions. A negative result may occur with  improper specimen collection/handling, submission of specimen other than nasopharyngeal swab, presence of viral mutation(s) within the areas targeted by this assay, and inadequate number of viral copies (<131 copies/mL). A negative result must be combined with clinical observations, patient history, and epidemiological information. The expected result is Negative. Fact Sheet for Patients:  https://www.moore.com/ Fact Sheet for Healthcare Providers:  https://www.young.biz/ This test is not yet ap proved or cleared by the [place] FDA and  has been authorized for detection and/or diagnosis of SARS-CoV-2 by FDA under an Emergency Use Authorization (EUA). This EUA will remain  in effect (meaning this test can be used) for the duration of the COVID-19 declaration under Section 564(b)(1) of the Act, 21 U.S.C. section 360bbb-3(b)(1), unless the authorization is terminated or revoked sooner.    Influenza A by PCR NEGATIVE NEGATIVE Final   Influenza B by PCR NEGATIVE  NEGATIVE Final    Comment: (NOTE) The Xpert Xpress SARS-CoV-2/FLU/RSV assay is intended as an aid in  the diagnosis of influenza from Nasopharyngeal swab specimens and  should not be used as a sole basis for treatment. Nasal washings and  aspirates are unacceptable for Xpert Xpress SARS-CoV-2/FLU/RSV  testing. Fact Sheet for Patients: https://www.moore.com/ Fact Sheet for Healthcare Providers: https://www.young.biz/ This test is not yet approved or cleared by the [place] FDA and  has been authorized for detection and/or diagnosis of SARS-CoV-2 by  FDA under an Emergency Use Authorization (EUA). This EUA will remain  in effect (meaning this test can be used) for the duration of the  Covid-19 declaration under Section 564(b)(1) of the Act, 21  U.S.C. section 360bbb-3(b)(1), unless the authorization is  terminated or revoked. Performed at [facility], [address], [place], [place] [address]   Surgical PCR screen     Status: None   Collection Time: 08/20/19  8:19 AM   Specimen: Nasal Mucosa; Nasal Swab  Result Value Ref Range Status   MRSA, PCR NEGATIVE NEGATIVE Final   Staphylococcus aureus NEGATIVE NEGATIVE Final    Comment: (NOTE) The Xpert SA Assay (FDA approved for NASAL specimens in patients 62 years of age and older), is  one component of a comprehensive surveillance program. It is not intended to diagnose infection nor to guide or monitor treatment. Performed at [facility], [place] [address] [place], [place] [address]   Aerobic/Anaerobic Culture (surgical/deep wound)     Status: None (Preliminary result)   Collection Time: 08/20/19  4:41 PM   Specimen: Abscess  Result Value Ref Range Status   Specimen Description   Final    ABSCESS SCROTUM Performed at [facility], [place] [address] [place], [place] [address]    Special Requests   Final    NONE Performed at [facility],  [place] [address] [place], [place] [address]    Gram Stain   Final    ABUNDANT WBC PRESENT, PREDOMINANTLY PMN ABUNDANT GRAM POSITIVE COCCI ABUNDANT GRAM NEGATIVE RODS    Culture   Final    CULTURE REINCUBATED FOR BETTER GROWTH Performed at [facility], [place] [address], [place], [place] [address]    Report Status PENDING  Incomplete    Please note: You were cared for by a hospitalist during your hospital stay. Once you are discharged, your primary care physician will handle any further medical issues. Please note that NO REFILLS for any discharge medications will be authorized once you are discharged, as it is imperative that you return to your primary care physician (or establish a relationship with a primary care physician if you do not have one) for your post hospital discharge needs so that they can reassess your need for medications and monitor your lab values.  Signed: Marlowe Aschoff Colbi Schiltz  [facility] 08/23/2019, 11:46 AM

## 2019-08-25 LAB — CULTURE, BLOOD (ROUTINE X 2)
Culture: NO GROWTH
Culture: NO GROWTH
Special Requests: ADEQUATE
Special Requests: ADEQUATE

## 2019-08-25 LAB — AEROBIC/ANAEROBIC CULTURE W GRAM STAIN (SURGICAL/DEEP WOUND)

## 2020-03-23 ENCOUNTER — Encounter (INDEPENDENT_AMBULATORY_CARE_PROVIDER_SITE_OTHER): Payer: Self-pay | Admitting: Ophthalmology

## 2020-03-23 ENCOUNTER — Ambulatory Visit (INDEPENDENT_AMBULATORY_CARE_PROVIDER_SITE_OTHER): Payer: BC Managed Care – PPO | Admitting: Ophthalmology

## 2020-03-23 ENCOUNTER — Other Ambulatory Visit: Payer: Self-pay

## 2020-03-23 DIAGNOSIS — E1165 Type 2 diabetes mellitus with hyperglycemia: Secondary | ICD-10-CM | POA: Diagnosis not present

## 2020-03-23 DIAGNOSIS — E113492 Type 2 diabetes mellitus with severe nonproliferative diabetic retinopathy without macular edema, left eye: Secondary | ICD-10-CM

## 2020-03-23 DIAGNOSIS — E113491 Type 2 diabetes mellitus with severe nonproliferative diabetic retinopathy without macular edema, right eye: Secondary | ICD-10-CM | POA: Diagnosis not present

## 2020-03-23 NOTE — Patient Instructions (Signed)
Patient understands critical importance of continued blood sugar control monitoring is his best chance to slow further progression. I will explained the patient that based upon today's preliminary findings I do not believe specific intraocular therapy will be warranted. This will need to be confirmed upon fluorescein angiography performed in the next 2 weeks

## 2020-03-23 NOTE — Progress Notes (Signed)
03/23/2020     CHIEF COMPLAINT Patient presents for Diabetic Eye Exam   HISTORY OF PRESENT ILLNESS: Andres Graham is a 44 y.o. male who presents to the clinic today for:   HPI    Diabetic Eye Exam    Vision is blurred for distance and is blurred for near.  Associated Symptoms Floaters.  Diabetes characteristics include Type 2.  Blood sugar level is controlled.  I, the attending physician,  performed the HPI with the patient and updated documentation appropriately.          Comments    Pt referred by Dr. Clydene Laming for diabetic exam  Pt c/o OS becoming blurry occasionally. Pt states he also sees "stars" in OS vision. BGL: did not check A1C: unknown       Last edited by Tilda Franco on 03/23/2020  2:29 PM. (History)      Referring physician: No referring provider defined for this encounter.  HISTORICAL INFORMATION:   Selected notes from the MEDICAL RECORD NUMBER    Lab Results  Component Value Date   HGBA1C 11.8 (H) 08/20/2019     CURRENT MEDICATIONS: No current outpatient medications on file. (Ophthalmic Drugs)   No current facility-administered medications for this visit. (Ophthalmic Drugs)   Current Outpatient Medications (Other)  Medication Sig  . blood glucose meter kit and supplies KIT Dispense based on patient and insurance preference. Use up to four times daily as directed. (FOR ICD-9 250.00, 250.01).  Marland Kitchen glipiZIDE (GLUCOTROL) 5 MG tablet Take 0.5 tablets (2.5 mg total) by mouth daily before breakfast.  . insulin NPH-regular Human (70-30) 100 UNIT/ML injection 10 units in am and 10 units in pm  Please provide 1 month supply  . lisinopril (ZESTRIL) 10 MG tablet Take 1 tablet (10 mg total) by mouth daily.  . metFORMIN (GLUCOPHAGE) 1000 MG tablet Take 1 tablet (1,000 mg total) by mouth 2 (two) times daily with a meal.   No current facility-administered medications for this visit. (Other)      REVIEW OF SYSTEMS: ROS    Positive for: Endocrine   Last  edited by Tilda Franco on 03/23/2020  2:29 PM. (History)       ALLERGIES No Active Allergies  PAST MEDICAL HISTORY Past Medical History:  Diagnosis Date  . Diabetes mellitus without complication [facility])    Past Surgical History:  Procedure Laterality Date  . SCROTAL EXPLORATION N/A 08/20/2019   Procedure: PERINEAL ABSCESS INCISION AND DRAINAGE/ SCROTUM EXPLORATION;  Surgeon: Ardis Hughs, MD;  Location: [facility];  Service: Urology;  Laterality: N/A;    FAMILY HISTORY Family History  Problem Relation Age of Onset  . Diabetes Mellitus II Father     SOCIAL HISTORY Social History   Tobacco Use  . Smoking status: Never Smoker  . Smokeless tobacco: Never Used  Substance Use Topics  . Alcohol use: No  . Drug use: No         OPHTHALMIC EXAM: Base Eye Exam    Visual Acuity (Snellen - Linear)      Right Left   Dist Delta 20/25 + 20/30       Tonometry (Tonopen, 2:34 PM)      Right Left   Pressure 13 16       Pupils      Pupils Dark Light Shape React APD   Right PERRL 3 2 Round Brisk None   Left PERRL 3 2 Round Brisk None       Visual Fields (  Counting fingers)      Left Right    Full Full       Neuro/Psych    Oriented x3: Yes   Mood/Affect: Normal       Dilation    Both eyes: 1.0% Mydriacyl, 2.5% Phenylephrine @ 2:34 PM        Slit Lamp and Fundus Exam    External Exam      Right Left   External Normal Normal       Slit Lamp Exam      Right Left   Lids/Lashes Normal Normal   Conjunctiva/Sclera White and quiet White and quiet   Cornea Clear Clear   Anterior Chamber Deep and quiet Deep and quiet   Iris Round and reactive Round and reactive   Lens 1+ Nuclear sclerosis 1+ Nuclear sclerosis   Anterior Vitreous Normal Normal       Fundus Exam      Right Left   Posterior Vitreous Normal Normal   Disc Normal Normal   C/D Ratio 0.4 0.4   Macula Microaneurysms, Exudates Microaneurysms, Exudates   Vessels NPDR-Severe NPDR-Severe    Periphery Normal Normal          IMAGING AND PROCEDURES  Imaging and Procedures for 03/23/20  OCT, Retina - OU - Both Eyes       Right Eye Quality was good. Scan locations included subfoveal. Central Foveal Thickness: 277. Progression has been stable. Findings include normal foveal contour.   Left Eye Quality was good. Scan locations included subfoveal. Central Foveal Thickness: 285. Progression has been stable. Findings include normal foveal contour.   Notes No overt retinal thickening on OCT in either eye although hard exudates are seen in the posterior pole  Macular temporally OU. Thus detectable diabetic macular edema is present yet in the absence of thickening clinically significant macular edema is not seen.                  ASSESSMENT/PLAN:  No problem-specific Assessment & Plan notes found for this encounter.      ICD-10-CM   1. 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 Severe nonproliferative diabetic retinopathy of left eye without macular edema associated with type 2 diabetes mellitus (HCC)  G88.1103 OCT, Retina - OU - Both Eyes    1. Diabetic macular edema is technically present in each eye with hard exudates in the posterior pole yet in the absence of detectable clinically significant thickening and good acuity, observation alone may be warranted  2. The last confirmatory fluorescein angiography will be performed in the near future to document the extent of peripheral retinal nonperfusion in these eyes with severe nonproliferative diabetic retinopathy as well as develop a baseline macular perfusion study.  3.  Ophthalmic Meds Ordered this visit:  No orders of the defined types were placed in this encounter.      Return in about 2 weeks (around 04/06/2020) for DILATE OU, OPTOS FFA R/L, COLOR FP.  There are no Patient  Instructions on file for this visit.   Explained the diagnoses, plan, and follow up with the patient and they expressed understanding.  Patient expressed understanding of the importance of proper follow up care.   Clent Demark Julaine Zimny M.D. Diseases & Surgery of the Retina and Vitreous [facility] [place] 03/23/20     Abbreviations: M myopia (nearsighted); A astigmatism; H  hyperopia (farsighted); P presbyopia; Mrx spectacle prescription;  CTL contact lenses; OD right eye; OS left eye; OU both eyes  XT exotropia; ET esotropia; PEK punctate epithelial keratitis; PEE punctate epithelial erosions; DES dry eye syndrome; MGD meibomian gland dysfunction; ATs artificial tears; PFAT's preservative free artificial tears; [place] nuclear sclerotic cataract; PSC posterior subcapsular cataract; ERM epi-retinal membrane; PVD posterior vitreous detachment; RD retinal detachment; DM diabetes mellitus; DR diabetic retinopathy; NPDR non-proliferative diabetic retinopathy; PDR proliferative diabetic retinopathy; CSME clinically significant macular edema; DME diabetic macular edema; dbh dot blot hemorrhages; CWS cotton wool spot; POAG primary open angle glaucoma; C/D cup-to-disc ratio; HVF humphrey visual field; GVF goldmann visual field; OCT optical coherence tomography; IOP intraocular pressure; BRVO Branch retinal vein occlusion; CRVO central retinal vein occlusion; CRAO central retinal artery occlusion; BRAO branch retinal artery occlusion; RT retinal tear; SB scleral buckle; PPV pars plana vitrectomy; VH Vitreous hemorrhage; PRP panretinal laser photocoagulation; IVK intravitreal kenalog; VMT vitreomacular traction; MH Macular hole;  NVD neovascularization of the disc; NVE neovascularization elsewhere; AREDS age related eye disease study; ARMD age related macular degeneration; POAG primary open angle glaucoma; EBMD epithelial/anterior basement membrane dystrophy; ACIOL anterior chamber intraocular lens; IOL intraocular  lens; PCIOL posterior chamber intraocular lens; Phaco/IOL phacoemulsification with intraocular lens placement; [place] photorefractive keratectomy; LASIK laser assisted in situ keratomileusis; HTN hypertension; DM diabetes mellitus; COPD chronic obstructive pulmonary disease

## 2020-03-23 NOTE — Assessment & Plan Note (Signed)
The nature of diabetic retinopathy was explained using the following analogy: "Retinopathy develops in the body's blood supply like salty water corrodes the linings of pipes in a house, until rust appears, then holes in the pipes develop which leak followed by destruction and loss of the pipes as the corrosion turns them to dust. In a similar fashion, Diabetes damages the blood supply of the body by cumulative long--term elevated blood sugar, which corrodes the blood supply in the body, particularly the blood vessels supplying the retina, kidneys, and nerves.  Thus, control of blood sugar, slows the progression of the corrosive effect of diabetes mellitus.  Patient understands that 2 years of good blood sugar control will assist in slowing the progression of diabetic retinopathy.

## 2020-04-05 ENCOUNTER — Encounter (INDEPENDENT_AMBULATORY_CARE_PROVIDER_SITE_OTHER): Payer: BC Managed Care – PPO | Admitting: Ophthalmology

## 2020-04-06 ENCOUNTER — Encounter (INDEPENDENT_AMBULATORY_CARE_PROVIDER_SITE_OTHER): Payer: BC Managed Care – PPO | Admitting: Ophthalmology

## 2020-04-12 ENCOUNTER — Encounter (INDEPENDENT_AMBULATORY_CARE_PROVIDER_SITE_OTHER): Payer: BC Managed Care – PPO | Admitting: Ophthalmology

## 2021-07-05 ENCOUNTER — Other Ambulatory Visit (HOSPITAL_COMMUNITY): Payer: Self-pay

## 2021-07-05 MED ORDER — OZEMPIC (0.25 OR 0.5 MG/DOSE) 2 MG/1.5ML ~~LOC~~ SOPN
0.2500 mg | PEN_INJECTOR | SUBCUTANEOUS | 1 refills | Status: AC
  Filled 2021-07-05: qty 1.5, 28d supply, fill #0
  Filled 2021-07-05: qty 3, 112d supply, fill #0

## 2021-07-31 IMAGING — CT CT ABD-PELV W/ CM
2 of 5 series · 16 of 46 positions shown, 18 images · IV contrast (Omni 300)
Comparison: None.

CLINICAL DATA: Bilateral testicular pain, swelling.  Dysuria.

EXAM:
CT ABDOMEN AND PELVIS WITH CONTRAST
TECHNIQUE: Multidetector CT imaging of the abdomen and pelvis was performed
using the standard protocol following bolus administration of
intravenous contrast.
CONTRAST:  125mL OMNIPAQUE IOHEXOL 300 MG/ML  SOLN

[Series 3: a/p w/ 5mm · axial · 0.98mm/px · z∈[+555,+1065]mm · 13 of 114 slices shown, 15 images]
[im 6/114  soft-tissue]
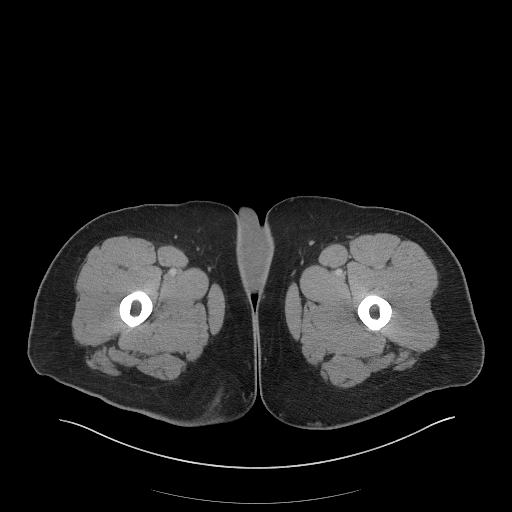
[im 6/114  bone]
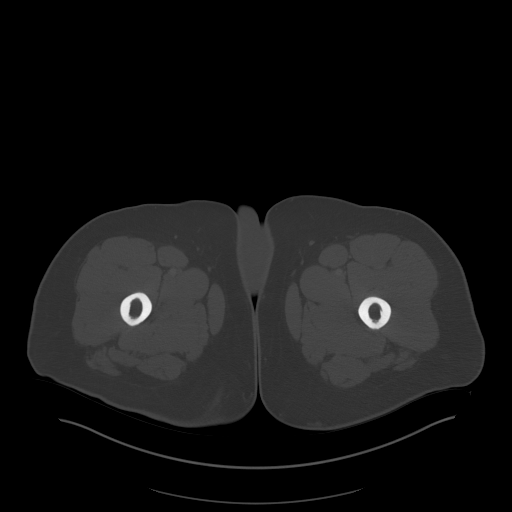
[im 17/114  soft-tissue]
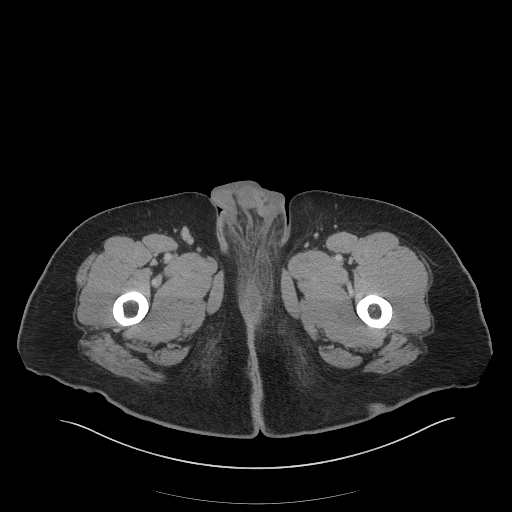
[im 22/114  soft-tissue]
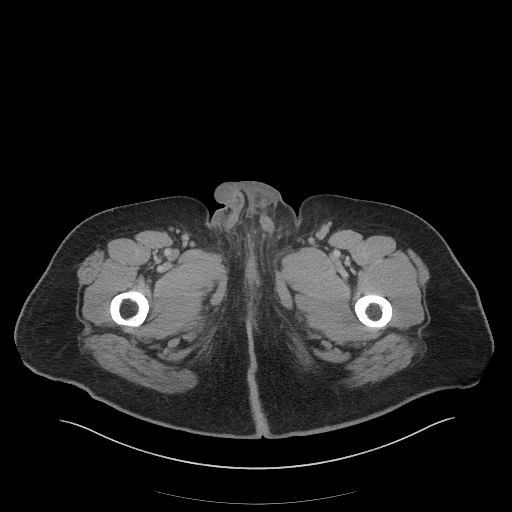
[im 33/114  soft-tissue]
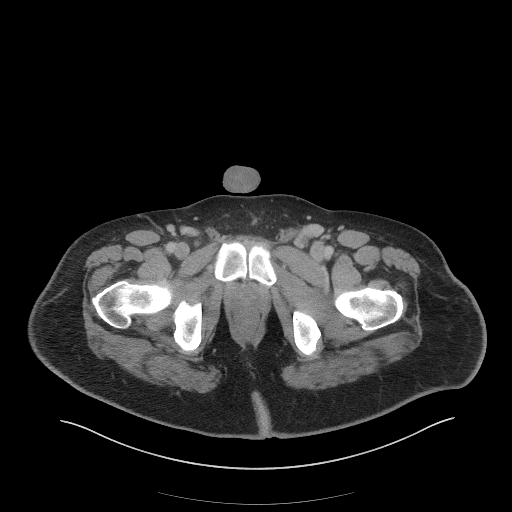
[im 38/114  soft-tissue]
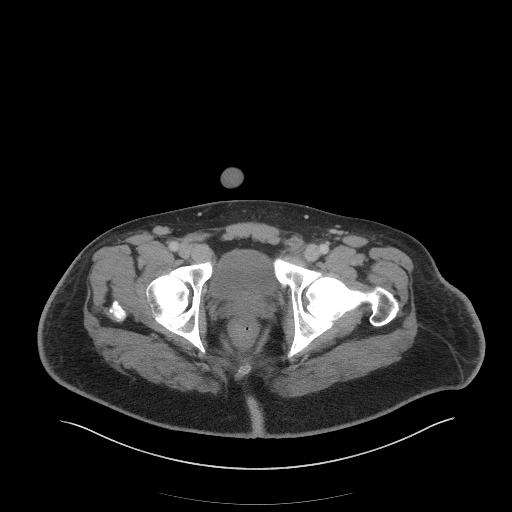
[im 49/114  soft-tissue]
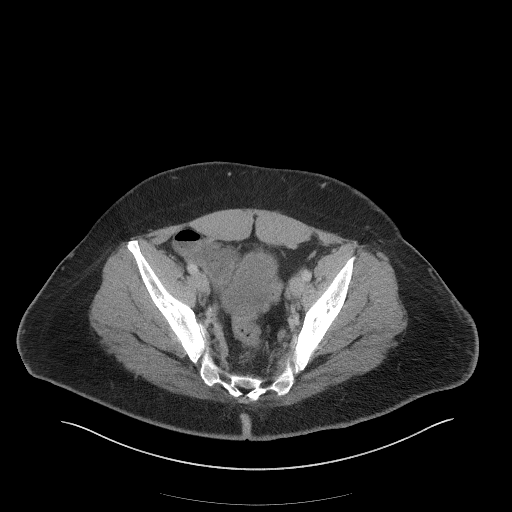
[im 60/114  soft-tissue]
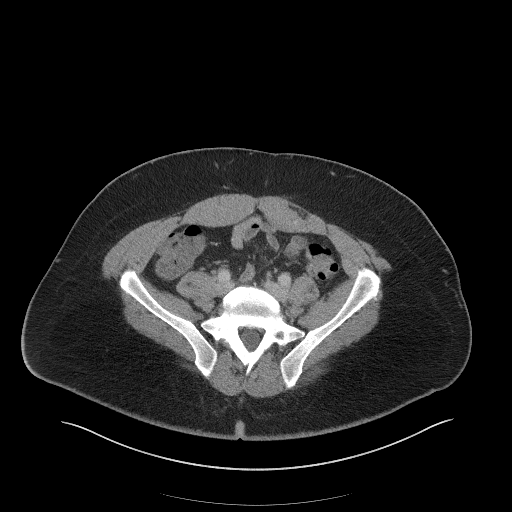
[im 65/114  soft-tissue]
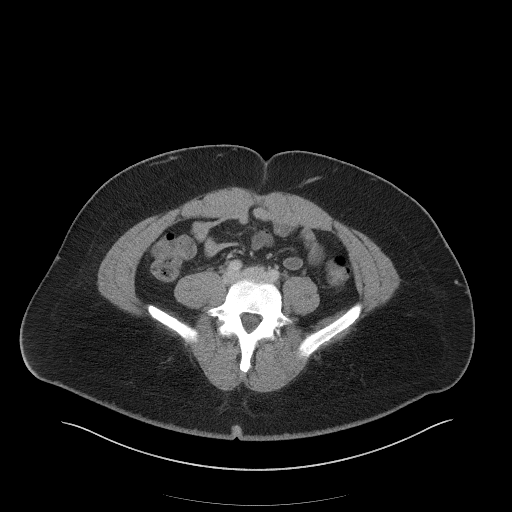
[im 76/114  soft-tissue]
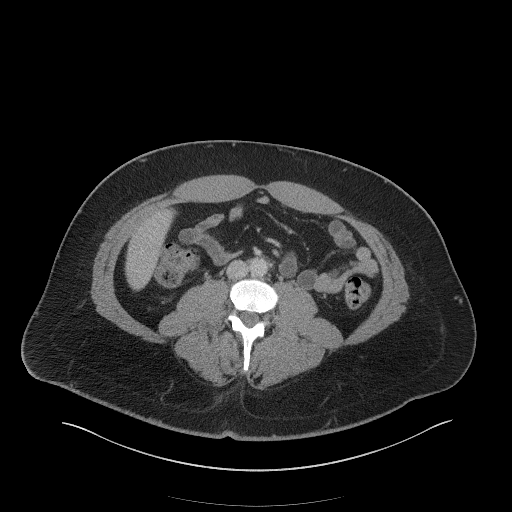
[im 76/114  bone]
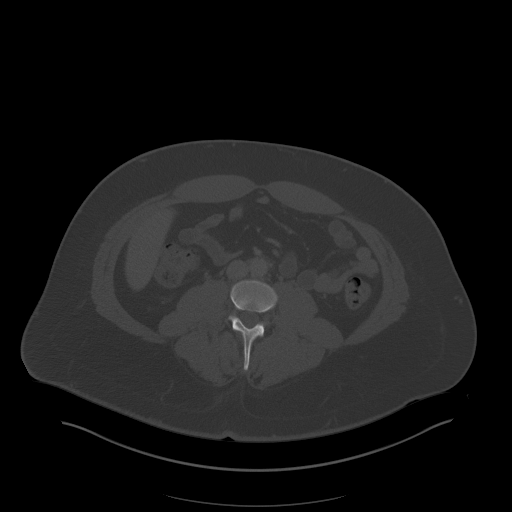
[im 81/114  soft-tissue]
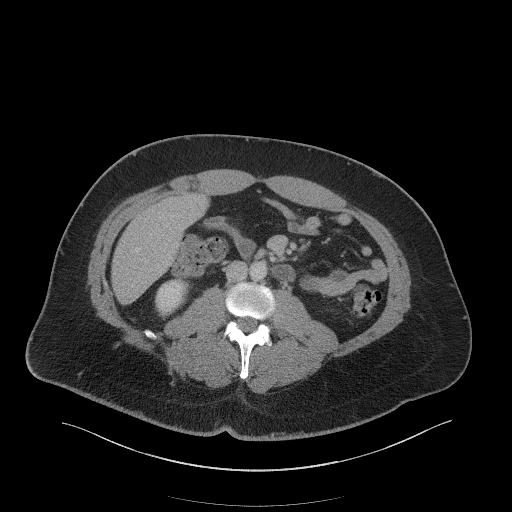
[im 92/114  soft-tissue]
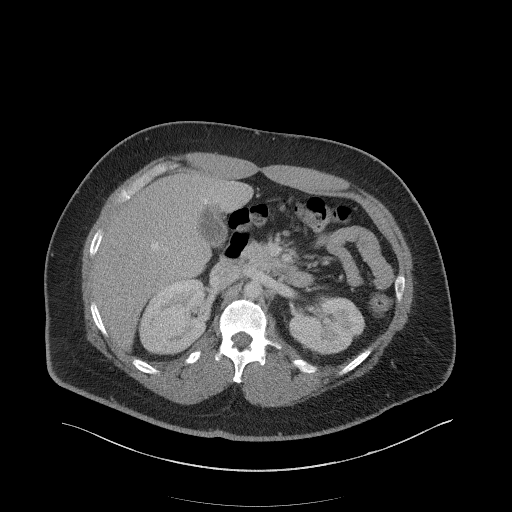
[im 97/114  soft-tissue]
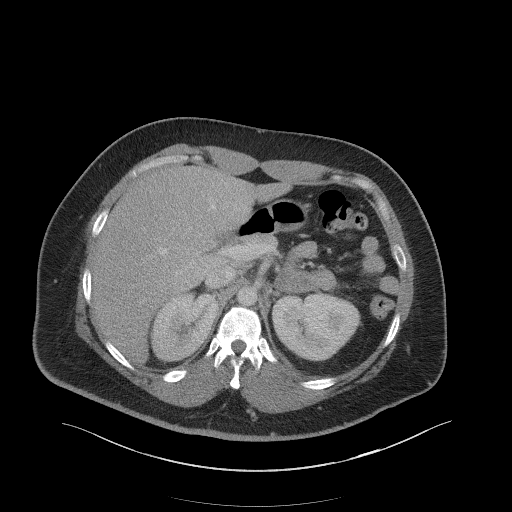
[im 108/114  soft-tissue]
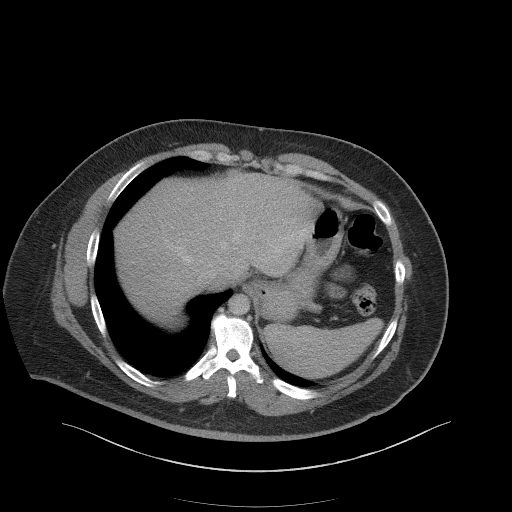

[Series 6: a/p w/ cor · coronal · 0.92mm/px · 3 of 147 slices shown]
[im 49/147  soft-tissue]
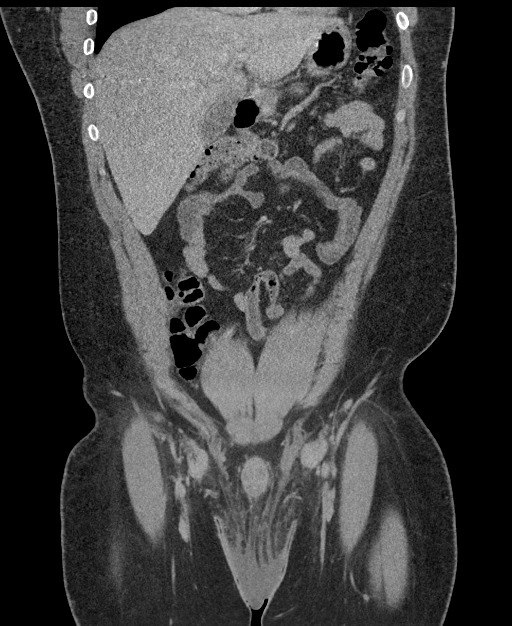
[im 65/147  soft-tissue]
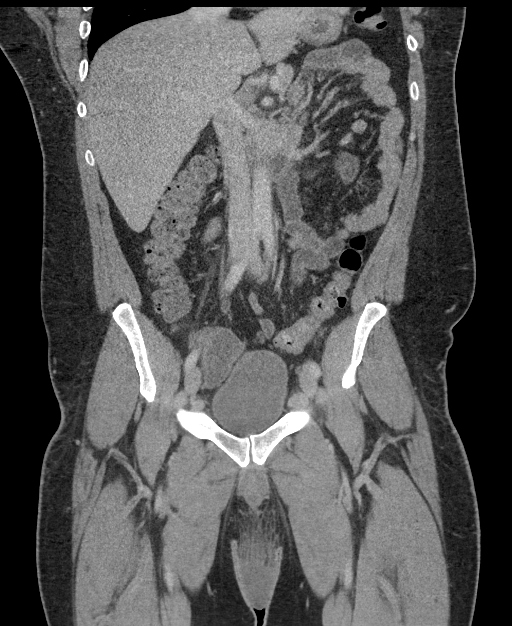
[im 82/147  soft-tissue]
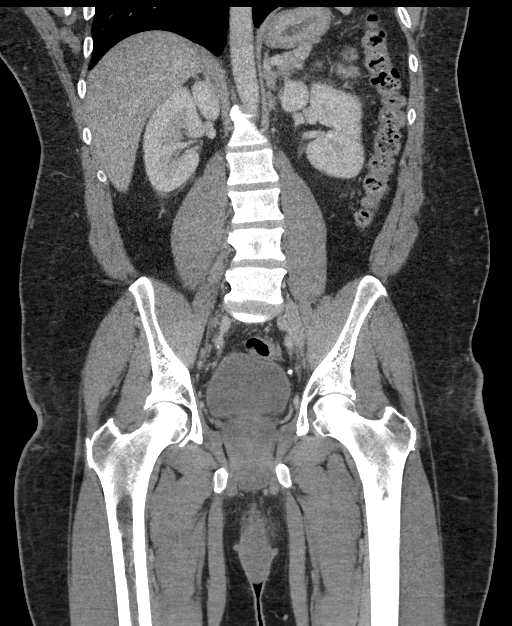

[16 of 46 positions shown; findings below may reference images not displayed]

FINDINGS: Lower chest: Lung bases are clear. No effusions. Heart is normal
size.

Hepatobiliary: Gallstones within the gallbladder. Diffuse fatty
infiltration of the liver. No focal hepatic abnormality or biliary
ductal dilatation.

Pancreas: No focal abnormality or ductal dilatation.

Spleen: No focal abnormality.  Normal size.

Adrenals/Urinary Tract: No adrenal abnormality. No focal renal
abnormality. No stones or hydronephrosis. Urinary bladder is
unremarkable.

Stomach/Bowel: Normal appendix. Stomach, large and small bowel
grossly unremarkable.

Vascular/Lymphatic: No evidence of aneurysm or adenopathy.

Reproductive: There appears to be scrotal wall edema and possible
small hydroceles.

Other: No free fluid or free air.

Musculoskeletal: No acute bony abnormality.
IMPRESSION: Apparent scrotal wall edema.  Probable small hydroceles.

Fatty infiltration of the liver.

Cholelithiasis.

## 2021-08-02 IMAGING — US US SCROTUM
1 series · 14 of 25 positions shown · non-contrast
Comparison: August 17, 2019

CLINICAL DATA: Scrotal swelling and pain

EXAM:
ULTRASOUND OF SCROTUM
TECHNIQUE: Complete ultrasound examination of the testicles, epididymis, and
other scrotal structures was performed.

[Series 1: us scrotum · 47 acquisitions, 14 frames shown]
[im 1/47]
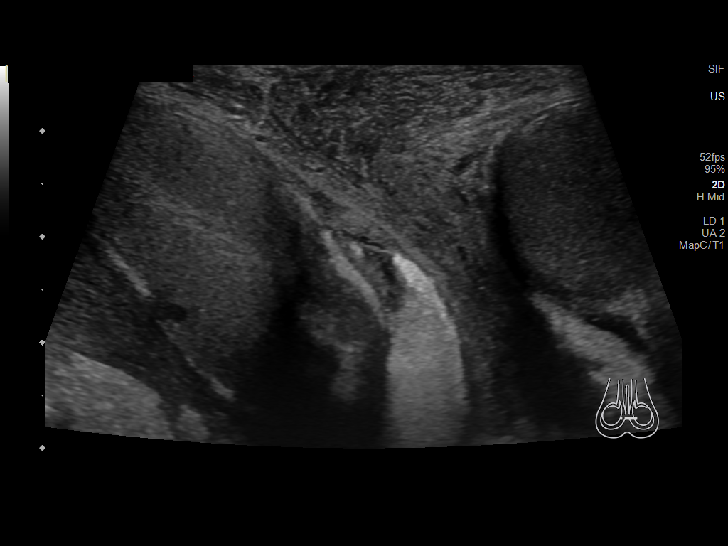
[im 4/47]
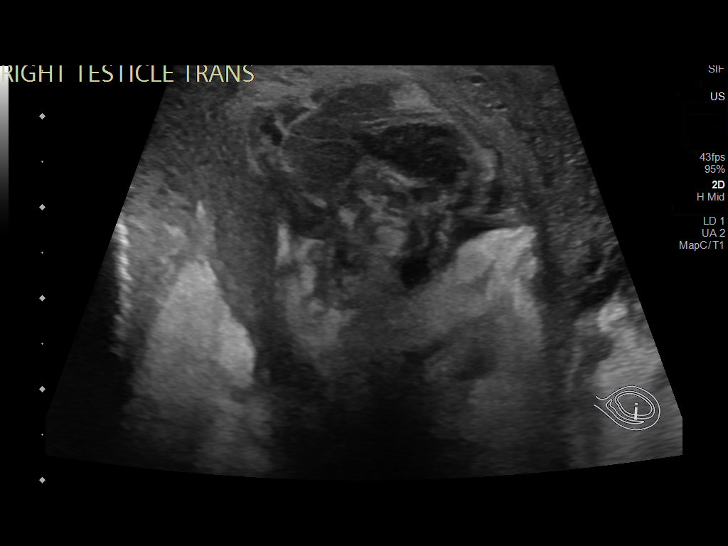
[im 8/47]
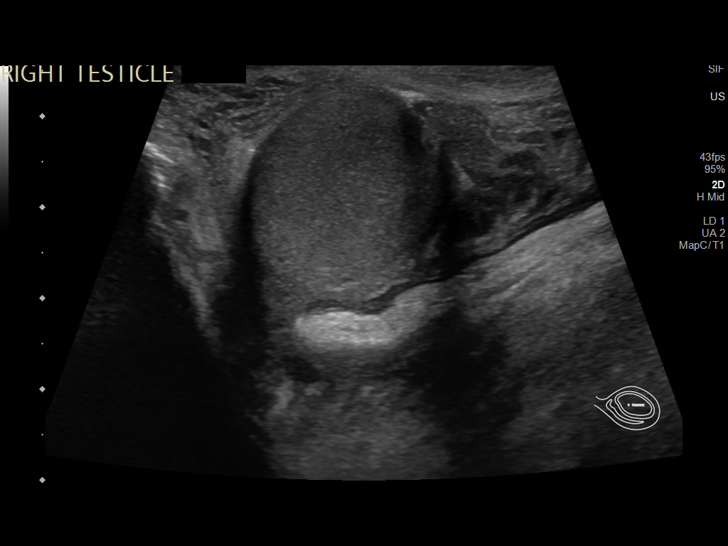
[im 12/47]
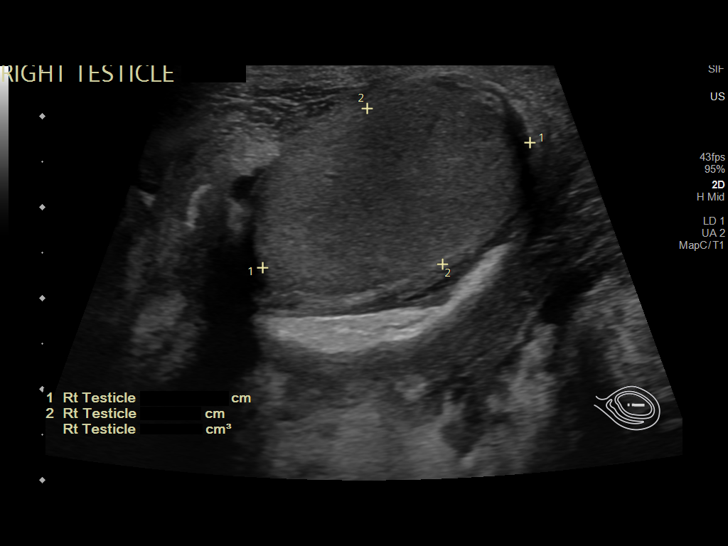
[im 16/47]
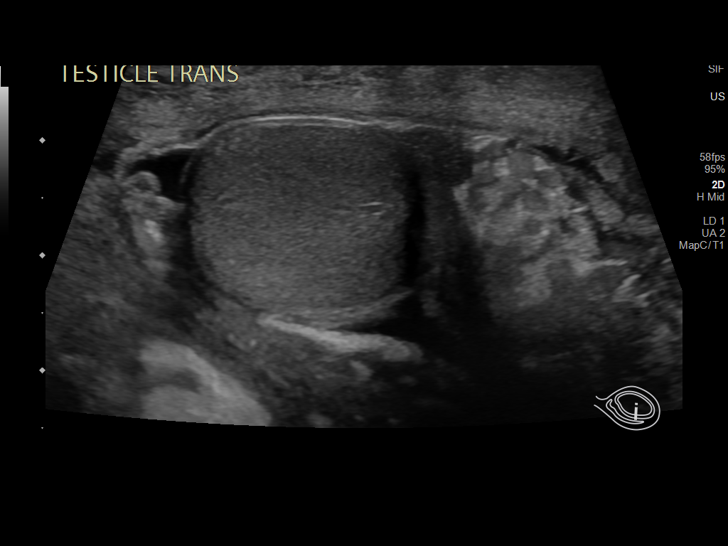
[im 18/47]
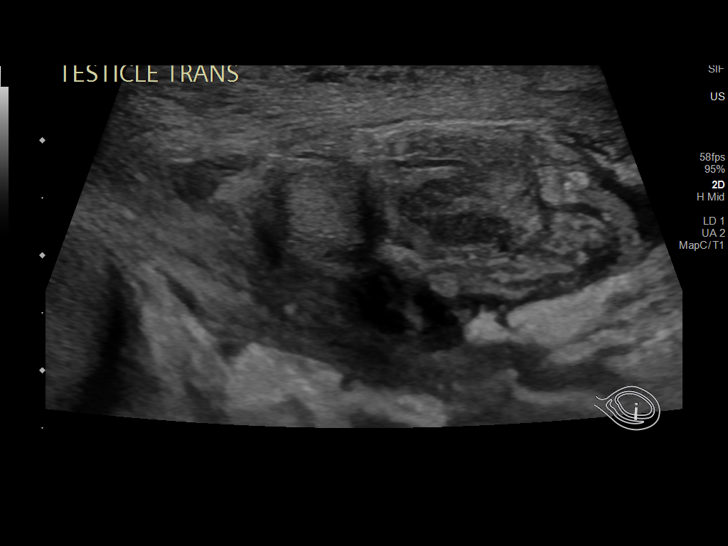
[im 22/47]
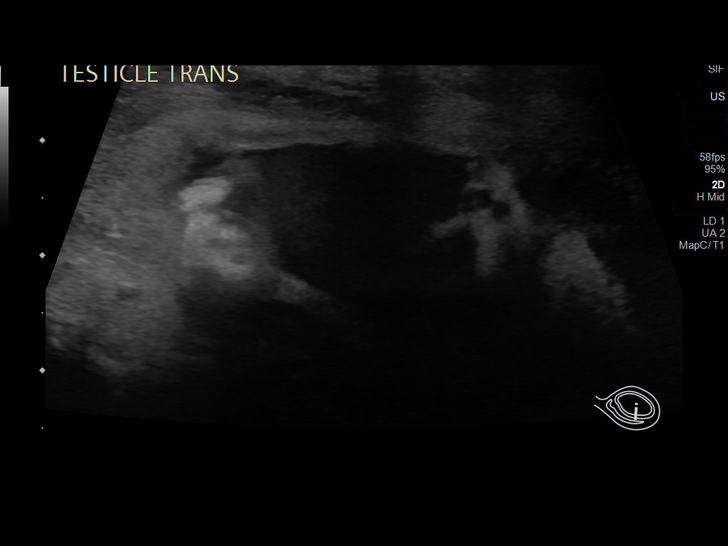
[im 25/47]
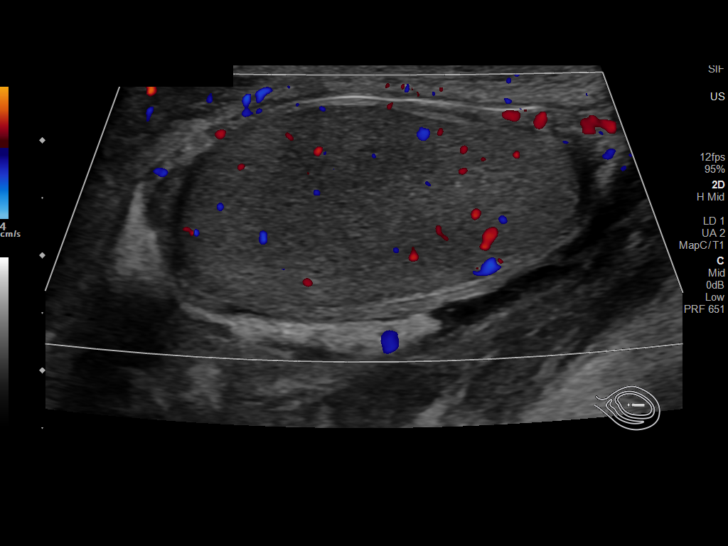
[im 29/47]
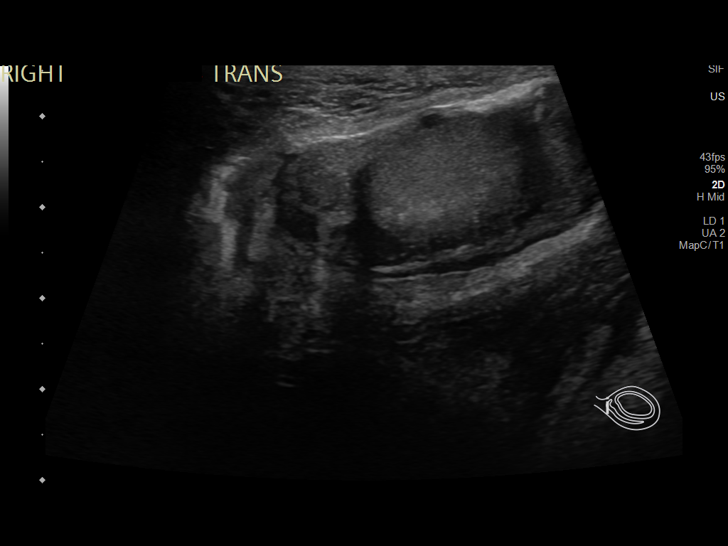
[im 31/47]
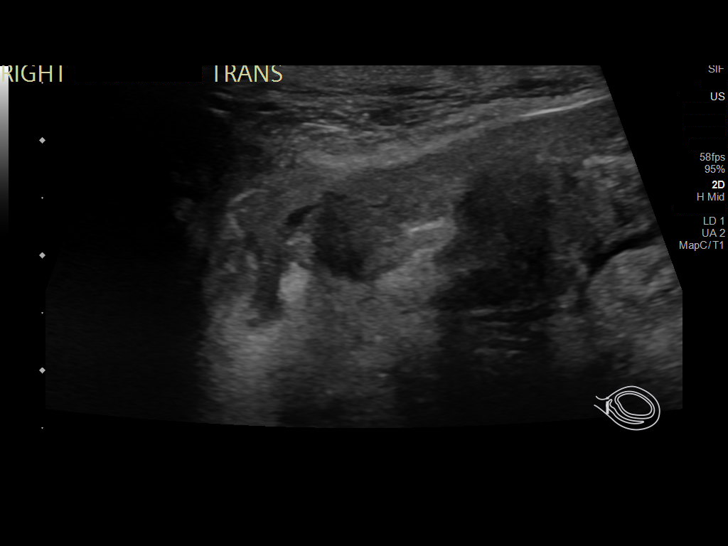
[im 35/47]
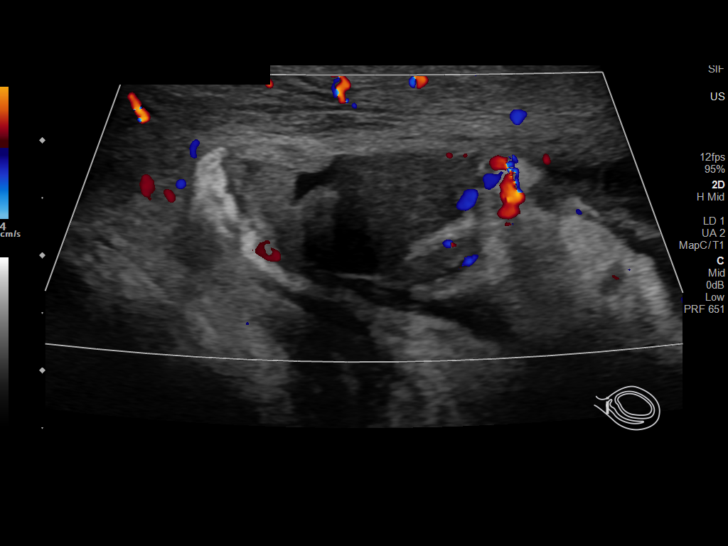
[im 39/47]
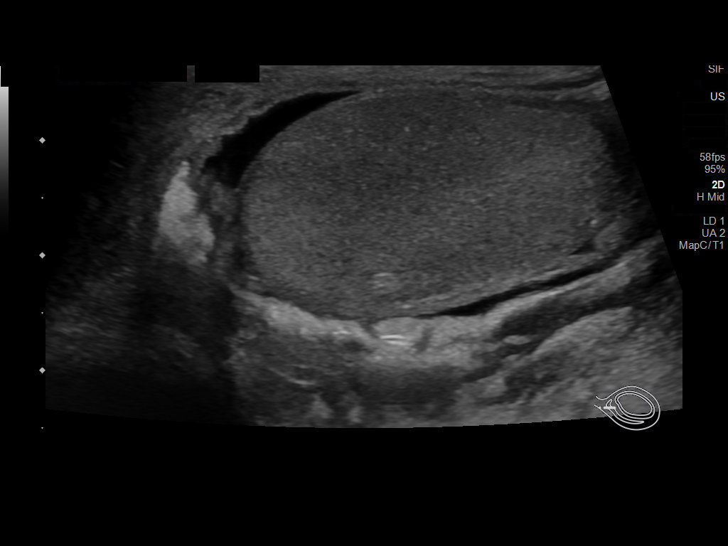
[im 43/47]
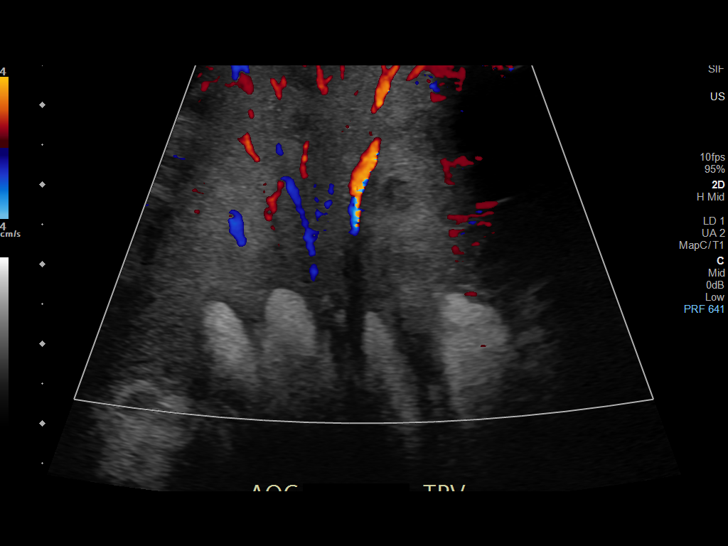
[im 47/47]
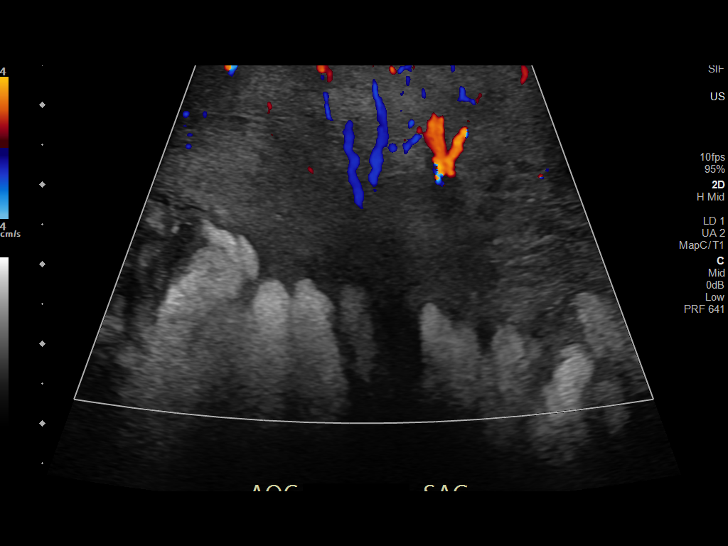

[14 of 25 positions shown; findings below may reference images not displayed]

FINDINGS: Right testicle

Measurements: 3.2 x 1.9 x 2.1 cm. There is scrotal wall subcutaneous
edema and skin thickening. No mass or microlithiasis visualized.

Left testicle

Measurements: 3.4 x 1.8 x 2.0 cm. There is scrotal wall subcutaneous
edema and skin thickening. No mass or microlithiasis visualized.

Right epididymis:  Normal in size and appearance.

Left epididymis:  Normal in size and appearance.

Hydrocele:  There is a trace left hydrocele present.

Varicocele:  None visualized.
IMPRESSION: Bilateral scrotal wall edema and skin thickening. No definite
scrotal sac fluid collection seen.

Trace left hydrocele present.

Partially visualized perineal soft tissue abscess.
